# Patient Record
Sex: Female | Born: 1975 | Race: White | Hispanic: No | Marital: Married | Smoking: Never smoker
Health system: Southern US, Community
[De-identification: ages and names within clinical notes are randomized; demographics above are authoritative.]

## PROBLEM LIST (undated history)

## (undated) DIAGNOSIS — G35 Multiple sclerosis: Secondary | ICD-10-CM

## (undated) HISTORY — PX: NO PAST SURGERIES: SHX2092

## (undated) HISTORY — DX: Multiple sclerosis: G35

---

## 1997-05-03 ENCOUNTER — Other Ambulatory Visit: Admission: RE | Admit: 1997-05-03 | Discharge: 1997-05-03 | Payer: Self-pay | Admitting: Obstetrics and Gynecology

## 1997-06-10 ENCOUNTER — Other Ambulatory Visit: Admission: RE | Admit: 1997-06-10 | Discharge: 1997-06-10 | Payer: Self-pay | Admitting: Obstetrics and Gynecology

## 2006-06-07 ENCOUNTER — Other Ambulatory Visit: Admission: RE | Admit: 2006-06-07 | Discharge: 2006-06-07 | Payer: Self-pay | Admitting: Obstetrics and Gynecology

## 2009-12-17 ENCOUNTER — Inpatient Hospital Stay (HOSPITAL_COMMUNITY): Admission: EM | Admit: 2009-12-17 | Discharge: 2009-12-19 | Payer: Self-pay | Admitting: Emergency Medicine

## 2009-12-17 ENCOUNTER — Ambulatory Visit: Payer: Self-pay | Admitting: Internal Medicine

## 2009-12-18 ENCOUNTER — Encounter (INDEPENDENT_AMBULATORY_CARE_PROVIDER_SITE_OTHER): Payer: Self-pay | Admitting: Internal Medicine

## 2010-05-28 LAB — BETA-2-GLYCOPROTEIN I ABS, IGG/M/A
Beta-2 Glyco I IgG: 0 G Units (ref <20)
Beta-2-Glycoprotein I IgM: 2 M Units (ref <20)

## 2010-05-28 LAB — CSF CELL COUNT WITH DIFFERENTIAL
Lymphs, CSF: 100 % — ABNORMAL HIGH (ref 40–80)
Monocyte-Macrophage-Spinal Fluid: 1 % — ABNORMAL LOW (ref 15–45)
RBC Count, CSF: 12 /mm3 — ABNORMAL HIGH
Tube #: 4
WBC, CSF: 18 /mm3 (ref 0–5)

## 2010-05-28 LAB — LUPUS ANTICOAGULANT PANEL
DRVVT: 38 secs (ref 36.2–44.3)
Lupus Anticoagulant: NOT DETECTED

## 2010-05-28 LAB — CBC
HCT: 40.8 % (ref 36.0–46.0)
MCHC: 33.1 g/dL (ref 30.0–36.0)
RDW: 13.6 % (ref 11.5–15.5)
WBC: 9.7 10*3/uL (ref 4.0–10.5)

## 2010-05-28 LAB — CK TOTAL AND CKMB (NOT AT ARMC)
Relative Index: INVALID (ref 0.0–2.5)
Relative Index: INVALID (ref 0.0–2.5)
Relative Index: INVALID (ref 0.0–2.5)
Total CK: 75 U/L (ref 7–177)

## 2010-05-28 LAB — SJOGRENS SYNDROME-B EXTRACTABLE NUCLEAR ANTIBODY: SSB (La) (ENA) Antibody, IgG: <1 AU/mL (ref <30)

## 2010-05-28 LAB — COMPREHENSIVE METABOLIC PANEL
ALT: 11 U/L (ref 0–35)
AST: 15 U/L (ref 0–37)
Albumin: 4.1 g/dL (ref 3.5–5.2)
Alkaline Phosphatase: 65 U/L (ref 39–117)
Calcium: 9.5 mg/dL (ref 8.4–10.5)
GFR calc Af Amer: >60 mL/min (ref >60)
Potassium: 3.6 mEq/L (ref 3.5–5.1)
Sodium: 141 mEq/L (ref 135–145)
Total Protein: 7.2 g/dL (ref 6.0–8.3)

## 2010-05-28 LAB — CARDIOLIPIN ANTIBODIES, IGG, IGM, IGA
Anticardiolipin IgA: 3 APL U/mL — ABNORMAL LOW (ref <22)
Anticardiolipin IgG: 6 GPL U/mL — ABNORMAL LOW (ref <23)

## 2010-05-28 LAB — FUNGUS CULTURE W SMEAR: Fungal Smear: NONE SEEN

## 2010-05-28 LAB — SEDIMENTATION RATE: Sed Rate: 4 mm/hr (ref 0–22)

## 2010-05-28 LAB — LIPID PANEL
Cholesterol: 147 mg/dL (ref 0–200)
LDL Cholesterol: 80 mg/dL (ref 0–99)
Total CHOL/HDL Ratio: 2.7 RATIO
Triglycerides: 58 mg/dL (ref <150)

## 2010-05-28 LAB — DIFFERENTIAL
Basophils Relative: 0 % (ref 0–1)
Eosinophils Absolute: 0.1 10*3/uL (ref 0.0–0.7)
Eosinophils Relative: 1 % (ref 0–5)
Lymphs Abs: 1.9 10*3/uL (ref 0.7–4.0)
Monocytes Absolute: 0.6 10*3/uL (ref 0.1–1.0)
Monocytes Relative: 6 % (ref 3–12)

## 2010-05-28 LAB — ANGIOTENSIN CONVERTING ENZYME: Angiotensin-Converting Enzyme: 29 U/L (ref 8–52)

## 2010-05-28 LAB — BODY FLUID CULTURE

## 2010-05-28 LAB — FACTOR 5 LEIDEN

## 2010-05-28 LAB — TROPONIN I
Troponin I: 0.02 ng/mL (ref 0.00–0.06)
Troponin I: 0.02 ng/mL (ref 0.00–0.06)

## 2010-05-28 LAB — PROTEIN C, TOTAL: Protein C, Total: >150 % — ABNORMAL HIGH (ref 70–140)

## 2010-05-28 LAB — VIRUS CULTURE

## 2010-05-28 LAB — C-REACTIVE PROTEIN: CRP: 0.1 mg/dL — ABNORMAL LOW (ref <0.6)

## 2010-05-28 LAB — GLUCOSE, CAPILLARY
Glucose-Capillary: 104 mg/dL — ABNORMAL HIGH (ref 70–99)
Glucose-Capillary: 153 mg/dL — ABNORMAL HIGH (ref 70–99)

## 2010-05-28 LAB — LYME DISEASE DNA BY PCR(BORRELIA BURG)

## 2010-05-28 LAB — PROTEIN AND GLUCOSE, CSF: Total  Protein, CSF: 52 mg/dL — ABNORMAL HIGH (ref 15–45)

## 2013-10-19 ENCOUNTER — Ambulatory Visit: Payer: Self-pay | Admitting: Diagnostic Neuroimaging

## 2013-10-22 NOTE — Telephone Encounter (Signed)
This encounter was created in error - please disregard.

## 2013-10-23 ENCOUNTER — Encounter: Payer: Self-pay | Admitting: *Deleted

## 2013-10-23 ENCOUNTER — Encounter: Payer: Self-pay | Admitting: Diagnostic Neuroimaging

## 2013-10-23 ENCOUNTER — Ambulatory Visit (INDEPENDENT_AMBULATORY_CARE_PROVIDER_SITE_OTHER): Payer: Medicaid Other | Admitting: Diagnostic Neuroimaging

## 2013-10-23 VITALS — BP 108/69 | HR 68 | Ht 62.0 in | Wt 145.4 lb

## 2013-10-23 DIAGNOSIS — G35 Multiple sclerosis: Secondary | ICD-10-CM | POA: Insufficient documentation

## 2013-10-23 NOTE — Patient Instructions (Signed)
Use gilenya samples. We will send in refill form.

## 2013-10-23 NOTE — Progress Notes (Signed)
[facility]  PATIENT: Anne Valenzuela DOB: February 20, 1976  REFERRING CLINICIAN:  HISTORY FROM: patient  REASON FOR VISIT: follow up   HISTORICAL  CHIEF COMPLAINT:  Chief Complaint  Patient presents with  . Follow-up    HA, MS    HISTORY OF PRESENT ILLNESS:   UPDATE 10/23/13: Patient last seen in clinic during PreferMS trial (last visit 10/25/11). Was getting gilenya through patient assistance. Did not have insurance, and therefore di not follow up in our clinic. Over last 1 year, was "stretching" out gilenya taking it every other day. Now out of meds x 1 week. Now has insurance and trying to get re-established. No new neuro symptoms. Overall doing well. Enrolledi nschool (funeral services program).   UPDATE 11/03/10: Doing well.  Tried TPX and indomethacin for HA, but didn't help.  HA have subsided on their own.   Now interested in PreferMS trial.  UPDATE 07/31/10: Has been having headaches (right sided, throbbing; no nausea, vomiting, photo or phono), almost daily but usually 1 hour after rebif injections.  tried fioricet with mild relief.  HA are 5/10 in severity.  Denies new numbness, weakness or vision changes.  No infx sxs.  UPDATE 05/15/10: Started on Rebif in end of Dec 2011.  Some injection site reactions.  Doing about the same.  Weakness essentially resolved.  Depression sxs better.  UPDATE 03/03/10: Doing much better.  All prior sxs essentially resolved.  Back to driving.  Vision, strength, bladder fx much better.  We discussed multiple sclerosis treatmets.  UPDATE 01/13/10: 38 year old female with possible MS.  Hosp f/u visit.  No new complaints.  RUE slightly better.  Still with blurred vision, balance diff. Patient's symptoms started in July 2011, with nausea and diagnosis of urinary tract infection. She then had a 20 pound weight loss. The beginning of October 2011, patient developed right arm weakness and clumsiness. She also had fuzzy vision, headache and  urge incontinence. She presented to [facility] and was found to have multiple subcortical white matter lesions on CT scan and confirmed on MRI. She had extensive workup with lab testing and spinal. She was treated with IV solumedrol and then oral prednisone.   REVIEW OF SYSTEMS: Full 14 system review of systems performed and notable only for freq painful urination.  ALLERGIES: No Known Allergies  HOME MEDICATIONS: Outpatient Prescriptions Prior to Visit  Medication Sig Dispense Refill  . Fingolimod HCl (GILENYA) 0.5 MG CAPS Take 1 capsule by mouth daily.       No facility-administered medications prior to visit.    PAST MEDICAL HISTORY: Past Medical History  Diagnosis Date  . MS (multiple sclerosis)     PAST SURGICAL HISTORY: History reviewed. No pertinent past surgical history.  FAMILY HISTORY: Family History  Problem Relation Age of Onset  . Diabetes Mother     SOCIAL HISTORY:  History   Social History  . Marital Status: Married    Spouse Name: N/A    Number of Children: 1  . Years of Education: HS   Occupational History  .  Other    student   Social History Main Topics  . Smoking status: Never Smoker   . Smokeless tobacco: Never Used  . Alcohol Use: No  . Drug Use: No  . Sexual Activity: Not on file   Other Topics Concern  . Not on file   Social History Narrative   Patient lives at home with her daughter.   Caffeine Use: tea 3  cups daily     PHYSICAL EXAM  Filed Vitals:   10/23/13 0822  BP: 108/69  Pulse: 68  Height: 5' 2"  (1.575 m)  Weight: 145 lb 6.4 oz (65.953 kg)    Not recorded    Body mass index is 26.59 kg/(m^2).  GENERAL EXAM: Patient is in no distress; well developed, nourished and groomed; neck is supple  CARDIOVASCULAR: Regular rate and rhythm, no murmurs, no carotid bruits  NEUROLOGIC: MENTAL STATUS: awake, alert, oriented to person, place and time, recent and remote memory intact, normal attention and concentration,  language fluent, comprehension intact, naming intact, fund of knowledge appropriate CRANIAL NERVE: no papilledema on fundoscopic exam, pupils equal and reactive to light, visual fields full to confrontation, extraocular muscles intact, no nystagmus, facial sensation and strength symmetric, hearing intact, palate elevates symmetrically, uvula midline, shoulder shrug symmetric, tongue midline. MOTOR: normal bulk and tone, full strength in the BUE, BLE; EXCEPT LLE 4+/5 SENSORY: normal and symmetric to light touch, pinprick, temperature, vibration and proprioception COORDINATION: finger-nose-finger, fine finger movements normal; LLE FOOT TAP SLOWER THAN RIGHT FOOT REFLEXES: deep tendon reflexes present and symmetric; BUE 2, KNEES 3, ANKLES 2, DOWN GOING TOES. GAIT/STATION: narrow based gait; romberg is negative    DIAGNOSTIC DATA (LABS, IMAGING, TESTING) - I reviewed patient records, labs, notes, testing and imaging myself where available.  Lab Results  Component Value Date   WBC 9.7 12/17/2009   HGB 13.5 12/17/2009   HCT 40.8 12/17/2009   MCV 93.4 12/17/2009   PLT 288 12/17/2009      Component Value Date/Time   NA 141 12/17/2009 1302   K 3.6 12/17/2009 1302   CL 110 12/17/2009 1302   CO2 25 12/17/2009 1302   GLUCOSE 90 12/17/2009 1302   BUN 6 12/17/2009 1302   CREATININE 0.65 12/17/2009 1302   CALCIUM 9.5 12/17/2009 1302   PROT 7.2 12/17/2009 1302   ALBUMIN 4.1 12/17/2009 1302   AST 15 12/17/2009 1302   ALT 11 12/17/2009 1302   ALKPHOS 65 12/17/2009 1302   BILITOT 0.5 12/17/2009 1302   GFRNONAA >60 12/17/2009 1302   GFRAA  Value: >60        The eGFR has been calculated using the MDRD equation. This calculation has not been validated in all clinical situations. eGFR's persistently <60 mL/min signify possible Chronic Kidney Disease. 12/17/2009 1302   Lab Results  Component Value Date   CHOL  Value: 147        ATP III CLASSIFICATION:  <200     mg/dL   Desirable  200-239  mg/dL   Borderline High  >=240     mg/dL   High        12/18/2009   HDL 55 12/18/2009   LDLCALC  Value: 80        Total Cholesterol/HDL:CHD Risk Coronary Heart Disease Risk Table                     Men   Women  1/2 Average Risk   3.4   3.3  Average Risk       5.0   4.4  2 X Average Risk   9.6   7.1  3 X Average Risk  23.4   11.0        Use the calculated Patient Ratio above and the CHD Risk Table to determine the patient's CHD Risk.        ATP III CLASSIFICATION (LDL):  <100     mg/dL  Optimal  100-129  mg/dL   Near or Above                    Optimal  130-159  mg/dL   Borderline  160-189  mg/dL   High  >190     mg/dL   Very High 12/18/2009   TRIG 58 12/18/2009   CHOLHDL 2.7 12/18/2009   Lab Results  Component Value Date   HGBA1C  Value: 5.3 (NOTE)                                                                       According to the ADA Clinical Practice Recommendations for 2011, when HbA1c is used as a screening test:   >=6.5%   Diagnostic of Diabetes Mellitus           (if abnormal result  is confirmed)  5.7-6.4%   Increased risk of developing Diabetes Mellitus  References:Diagnosis and Classification of Diabetes Mellitus,Diabetes KGYJ,8563,14(HFWYO 1):S62-S69 and Standards of Medical Care in         Diabetes - 2011,Diabetes VZCH,8850,27  (Suppl 1):S11-S61. 12/18/2009   No results found for this basename: [id number]   Lab Results  Component Value Date   TSH 3.463 12/18/2009    10/13/13 MRI brain (with and without contrast) demonstrating: 1. Multiple supratentorial and infratentorial chronic demyelinating plaques.  2. No abnormal enhancing lesions 3. No significant change from MRI on 05/05/11.   ASSESSMENT AND PLAN  38 y.o. year old female here with relapsing remitting multiple sclerosis dx'd in 2011. Extensive workup was done in the hospital from October 5-7, 2011.  MRI brain showed multiple intracranial lesions, multiple enhancing lesions, including asymptomatic and symptomatic enhancing lesions.  MRI of the cervical and  thoracic spine showed no intrinsic spinal cord lesions. CSF analysis demonstrated greater than 5 oligoclonal bands, with slight elevation in protein.  Extensive testing ruled out other mimicking conditions (HIV, TSH, ANA, hypercoag panel).  Repeat MRI brain on 02/13/10, showed decrease in lesion size and enhancement. Initially on rebif, now on gileyna since 11/17/10.   Dx: RRMS  PLAN: 1. Check MRI, labs 2. Continue gilenya; samples given and enrollment form filled  Orders Placed This Encounter  Procedures  . MR Brain W Wo Contrast  . CBC With differential/Platelet  . Vit D  25 hydroxy (rtn osteoporosis monitoring)   Return in about 3 months (around 01/23/2014).    Penni Bombard, MD 10/01/9468, 9:62 AM Certified in Neurology, Neurophysiology and Neuroimaging  [facility] [address], [place], [place] [address] [phone]

## 2013-10-24 LAB — CBC WITH DIFFERENTIAL
BASOS ABS: 0 10*3/uL (ref 0.0–0.2)
Basos: 0 %
Eos: 2 %
Eosinophils Absolute: 0.2 10*3/uL (ref 0.0–0.4)
HEMATOCRIT: 42.3 % (ref 34.0–46.6)
Hemoglobin: 13.9 g/dL (ref 11.1–15.9)
IMMATURE GRANULOCYTES: 0 %
Immature Grans (Abs): 0 10*3/uL (ref 0.0–0.1)
LYMPHS ABS: 0.9 10*3/uL (ref 0.7–3.1)
Lymphs: 10 %
MCH: 30.5 pg (ref 26.6–33.0)
MCHC: 32.9 g/dL (ref 31.5–35.7)
MCV: 93 fL (ref 79–97)
MONOS ABS: 0.6 10*3/uL (ref 0.1–0.9)
Monocytes: 8 %
NEUTROS ABS: 6.5 10*3/uL (ref 1.4–7.0)
Neutrophils Relative %: 80 %
PLATELETS: 276 10*3/uL (ref 150–379)
RBC: 4.56 x10E6/uL (ref 3.77–5.28)
RDW: 13.7 % (ref 12.3–15.4)
WBC: 8.2 10*3/uL (ref 3.4–10.8)

## 2013-10-24 LAB — VITAMIN D 25 HYDROXY (VIT D DEFICIENCY, FRACTURES): Vit D, 25-Hydroxy: 35.2 ng/mL (ref 30.0–100.0)

## 2013-10-26 ENCOUNTER — Inpatient Hospital Stay
Admission: RE | Admit: 2013-10-26 | Discharge: 2013-10-26 | Disposition: A | Discharge status: Home / Self Care | Payer: Self-pay | Source: Ambulatory Visit | Attending: Diagnostic Neuroimaging | Admitting: Diagnostic Neuroimaging

## 2013-11-05 ENCOUNTER — Ambulatory Visit
Admission: RE | Admit: 2013-11-05 | Discharge: 2013-11-05 | Disposition: A | Discharge status: Home / Self Care | Payer: Medicaid Other | Source: Ambulatory Visit | Attending: Diagnostic Neuroimaging | Admitting: Diagnostic Neuroimaging

## 2013-11-05 DIAGNOSIS — G35 Multiple sclerosis: Secondary | ICD-10-CM

## 2013-11-05 MED ORDER — GADOBENATE DIMEGLUMINE 529 MG/ML IV SOLN
13.0000 mL | Freq: Once | INTRAVENOUS | Status: AC | PRN
  Administered 2013-11-05: 13 mL via INTRAVENOUS

## 2014-01-31 ENCOUNTER — Ambulatory Visit (INDEPENDENT_AMBULATORY_CARE_PROVIDER_SITE_OTHER): Payer: Medicaid Other | Admitting: Diagnostic Neuroimaging

## 2014-01-31 ENCOUNTER — Encounter: Payer: Self-pay | Admitting: Diagnostic Neuroimaging

## 2014-01-31 VITALS — BP 112/74 | HR 65 | Temp 97.4°F | Ht 63.0 in | Wt 140.0 lb

## 2014-01-31 DIAGNOSIS — G35 Multiple sclerosis: Secondary | ICD-10-CM

## 2014-01-31 MED ORDER — SERTRALINE HCL 25 MG PO TABS: 25.0000 mg | ORAL_TABLET | Freq: Every day | ORAL | Status: DC

## 2014-01-31 NOTE — Progress Notes (Signed)
[facility]  PATIENT: Anne Valenzuela DOB: 02/15/1976  REFERRING CLINICIAN:  HISTORY FROM: patient  REASON FOR VISIT: follow up   HISTORICAL  CHIEF COMPLAINT:  Chief Complaint  Patient presents with  . Follow-up    MS    HISTORY OF PRESENT ILLNESS:   UPDATE 01/31/14: Since last visit, is back on gilenya consistently. 2-3 weeks ago, had right hand numb/weak, slurred speech, bladder incontinence. Symptoms now almost resolved for 3 days. Didn't call us b/c she had this appt planned for today. Also with more depression, anxiety.   UPDATE 10/23/13: Patient last seen in clinic during PreferMS trial (last visit 10/25/11). Was getting gilenya through patient assistance. Did not have insurance, and therefore did not follow up in our clinic. Over last 1 year, was "stretching" out gilenya taking it every other day. Now out of meds x 1 week. Now has insurance and trying to get re-established. No new neuro symptoms. Overall doing well. Enrolledi nschool (funeral services program).   UPDATE 11/03/10: Doing well.  Tried TPX and indomethacin for HA, but didn't help.  HA have subsided on their own.   Now interested in PreferMS trial.  UPDATE 07/31/10: Has been having headaches (right sided, throbbing; no nausea, vomiting, photo or phono), almost daily but usually 1 hour after rebif injections.  tried fioricet with mild relief.  HA are 5/10 in severity.  Denies new numbness, weakness or vision changes.  No infx sxs.  UPDATE 05/15/10: Started on Rebif in end of Dec 2011.  Some injection site reactions.  Doing about the same.  Weakness essentially resolved.  Depression sxs better.  UPDATE 03/03/10: Doing much better.  All prior sxs essentially resolved.  Back to driving.  Vision, strength, bladder fx much better.  We discussed multiple sclerosis treatmets.  UPDATE 01/13/10: 38 year old female with possible MS.  Hosp f/u visit.  No new complaints.  RUE slightly better.  Still with  blurred vision, balance diff. Patient's symptoms started in July 2011, with nausea and diagnosis of urinary tract infection. She then had a 20 pound weight loss. The beginning of October 2011, patient developed right arm weakness and clumsiness. She also had fuzzy vision, headache and urge incontinence. She presented to [facility] and was found to have multiple subcortical white matter lesions on CT scan and confirmed on MRI. She had extensive workup with lab testing and spinal. She was treated with IV solumedrol and then oral prednisone.   REVIEW OF SYSTEMS: Full 14 system review of systems performed and notable as per HPI.  ALLERGIES: No Known Allergies  HOME MEDICATIONS: Outpatient Prescriptions Prior to Visit  Medication Sig Dispense Refill  . Fingolimod HCl (GILENYA) 0.5 MG CAPS Take 1 capsule by mouth daily.     No facility-administered medications prior to visit.    PAST MEDICAL HISTORY: Past Medical History  Diagnosis Date  . MS (multiple sclerosis)     PAST SURGICAL HISTORY: History reviewed. No pertinent past surgical history.  FAMILY HISTORY: Family History  Problem Relation Age of Onset  . Diabetes Mother     SOCIAL HISTORY:  History   Social History  . Marital Status: Married    Spouse Name: N/A    Number of Children: 1  . Years of Education: HS   Occupational History  .  Other    student   Social History Main Topics  . Smoking status: Never Smoker   . Smokeless tobacco: Never Used  . Alcohol Use: No  .  Drug Use: No  . Sexual Activity: Not on file   Other Topics Concern  . Not on file   Social History Narrative   Patient lives at home with her daughter.   Caffeine Use: tea 3 cups daily     PHYSICAL EXAM  Filed Vitals:   01/31/14 1531  BP: 112/74  Pulse: 65  Temp: 97.4 F (36.3 C)  TempSrc: Oral  Height: _0  (1.6 m)  Weight: 140 lb (63.504 kg)    Not recorded      Body mass index is 24.81 kg/(m^2).  GENERAL EXAM: Patient  is in no distress; well developed, nourished and groomed; neck is supple  CARDIOVASCULAR: Regular rate and rhythm, no murmurs, no carotid bruits  NEUROLOGIC: MENTAL STATUS: awake, alert, oriented to person, place and time, recent and remote memory intact, normal attention and concentration, language fluent, comprehension intact, naming intact, fund of knowledge appropriate CRANIAL NERVE: no papilledema on fundoscopic exam, pupils equal and reactive to light, visual fields full to confrontation, extraocular muscles intact, no nystagmus, facial sensation and strength symmetric, hearing intact, palate elevates symmetrically, uvula midline, shoulder shrug symmetric, tongue midline. MOTOR: normal bulk and tone, full strength in the BUE, BLE; EXCEPT RUE 4+ GRIP, RLE 4+;  SENSORY: normal and symmetric to light touch COORDINATION: finger-nose-finger, fine finger movements normal; SLOW IN RUE AND RLE. REFLEXES: deep tendon reflexes present and symmetric; BUE 2, KNEES 3, ANKLES 2 GAIT/STATION: narrow based gait; SLIGHT LIMP WITH RIGHT LEG     DIAGNOSTIC DATA (LABS, IMAGING, TESTING) - I reviewed patient records, labs, notes, testing and imaging myself where available.  Lab Results  Component Value Date   WBC 8.2 10/23/2013   HGB 13.9 10/23/2013   HCT 42.3 10/23/2013   MCV 93 10/23/2013   PLT 276 10/23/2013      Component Value Date/Time   NA 141 12/17/2009 1302   K 3.6 12/17/2009 1302   CL 110 12/17/2009 1302   CO2 25 12/17/2009 1302   GLUCOSE 90 12/17/2009 1302   BUN 6 12/17/2009 1302   CREATININE 0.65 12/17/2009 1302   CALCIUM 9.5 12/17/2009 1302   PROT 7.2 12/17/2009 1302   ALBUMIN 4.1 12/17/2009 1302   AST 15 12/17/2009 1302   ALT 11 12/17/2009 1302   ALKPHOS 65 12/17/2009 1302   BILITOT 0.5 12/17/2009 1302   GFRNONAA >60 12/17/2009 1302   GFRAA  12/17/2009 1302    >60        The eGFR has been calculated using the MDRD equation. This calculation has not been validated in  all clinical situations. eGFR's persistently <60 mL/min signify possible Chronic Kidney Disease.   Lab Results  Component Value Date   CHOL  12/18/2009    147        ATP III CLASSIFICATION:  <200     mg/dL   Desirable  200-239  mg/dL   Borderline High  >=240    mg/dL   High          HDL 55 12/18/2009   LDLCALC  12/18/2009    80        Total Cholesterol/HDL:CHD Risk Coronary Heart Disease Risk Table                     Men   Women  1/2 Average Risk   3.4   3.3  Average Risk       5.0   4.4  2 X Average Risk   9.6  7.1  3 X Average Risk  23.4   11.0        Use the calculated Patient Ratio above and the CHD Risk Table to determine the patient's CHD Risk.        ATP III CLASSIFICATION (LDL):  <100     mg/dL   Optimal  100-129  mg/dL   Near or Above                    Optimal  130-159  mg/dL   Borderline  160-189  mg/dL   High  >190     mg/dL   Very High   TRIG 58 12/18/2009   CHOLHDL 2.7 12/18/2009   Lab Results  Component Value Date   HGBA1C  12/18/2009    5.3 (NOTE)                                                                       According to the ADA Clinical Practice Recommendations for 2011, when HbA1c is used as a screening test:   >=6.5%   Diagnostic of Diabetes Mellitus           (if abnormal result  is confirmed)  5.7-6.4%   Increased risk of developing Diabetes Mellitus  References:Diagnosis and Classification of Diabetes Mellitus,Diabetes TDVV,6160,73(XTGGY 1):S62-S69 and Standards of Medical Care in         Diabetes - 2011,Diabetes Care,2011,34  (Suppl 1):S11-S61.   No results found for: VITAMINB12 Lab Results  Component Value Date   TSH 3.463 12/18/2009    10/13/13 MRI brain (with and without contrast) demonstrating: 1. Multiple supratentorial and infratentorial chronic demyelinating plaques.  2. No abnormal enhancing lesions 3. No significant change from MRI on 05/05/11.   ASSESSMENT AND PLAN  38 y.o. year old female here with relapsing  remitting multiple sclerosis dx'd in 2011. Extensive workup was done in the hospital from October 5-7, 2011.  MRI brain showed multiple intracranial lesions, multiple enhancing lesions, including asymptomatic and symptomatic enhancing lesions.  MRI of the cervical and thoracic spine showed no intrinsic spinal cord lesions. CSF analysis demonstrated greater than 5 oligoclonal bands, with slight elevation in protein.  Extensive testing ruled out other mimicking conditions (HIV, TSH, ANA, hypercoag panel).  Repeat MRI brain on 02/13/10, showed decrease in lesion size and enhancement. Initially on rebif, now on gileyna since 11/17/10.   Now with possible MS exacerbation 2-3 weeks ago, with almost complete resolution for past 2-3 days.   Dx: RRMS  PLAN: 1. Check MRI, labs 2. Continue gilenya; may consider changing gilenya after testing 3. Sertraline for depression/anxiety  Orders Placed This Encounter  Procedures  . MR Brain W Wo Contrast  . MR Cervical Spine W Wo Contrast  . CBC With differential/Platelet  . Hepatic function panel  . Stratify JCV Antibody Test ([facility])  . Vit D  25 hydroxy (rtn osteoporosis monitoring)   Meds ordered this encounter  Medications  . sertraline (ZOLOFT) 25 MG tablet    Sig: Take 1 tablet (25 mg total) by mouth daily.    Dispense:  30 tablet    Refill:  6   Return in about 1 month (around 03/02/2014).    Penni Bombard, MD 01/31/2014, 3:59 PM  Certified in Neurology, Neurophysiology and [facility] [address] [place], [place] [address] [phone]

## 2014-02-01 LAB — CBC WITH DIFFERENTIAL
Basophils Absolute: 0 10*3/uL (ref 0.0–0.2)
Basos: 0 %
EOS: 3 %
Eosinophils Absolute: 0.1 10*3/uL (ref 0.0–0.4)
HEMATOCRIT: 41.4 % (ref 34.0–46.6)
Hemoglobin: 14 g/dL (ref 11.1–15.9)
IMMATURE GRANULOCYTES: 0 %
Immature Grans (Abs): 0 10*3/uL (ref 0.0–0.1)
LYMPHS ABS: 0.5 10*3/uL — AB (ref 0.7–3.1)
Lymphs: 11 %
MCH: 30.4 pg (ref 26.6–33.0)
MCHC: 33.8 g/dL (ref 31.5–35.7)
MCV: 90 fL (ref 79–97)
MONOCYTES: 14 %
MONOS ABS: 0.7 10*3/uL (ref 0.1–0.9)
NEUTROS ABS: 3.5 10*3/uL (ref 1.4–7.0)
Neutrophils Relative %: 72 %
PLATELETS: 294 10*3/uL (ref 150–379)
RBC: 4.6 x10E6/uL (ref 3.77–5.28)
RDW: 13.4 % (ref 12.3–15.4)
WBC: 4.8 10*3/uL (ref 3.4–10.8)

## 2014-02-01 LAB — HEPATIC FUNCTION PANEL
ALBUMIN: 4.8 g/dL (ref 3.5–5.5)
ALK PHOS: 66 IU/L (ref 39–117)
ALT: 22 IU/L (ref 0–32)
AST: 22 IU/L (ref 0–40)
BILIRUBIN TOTAL: 0.4 mg/dL (ref 0.0–1.2)
Bilirubin, Direct: 0.11 mg/dL (ref 0.00–0.40)
Total Protein: 7.1 g/dL (ref 6.0–8.5)

## 2014-02-01 LAB — VITAMIN D 25 HYDROXY (VIT D DEFICIENCY, FRACTURES): Vit D, 25-Hydroxy: 39.1 ng/mL (ref 30.0–100.0)

## 2014-02-22 ENCOUNTER — Ambulatory Visit
Admission: RE | Admit: 2014-02-22 | Discharge: 2014-02-22 | Disposition: A | Discharge status: Home / Self Care | Payer: Medicaid Other | Source: Ambulatory Visit | Attending: Diagnostic Neuroimaging | Admitting: Diagnostic Neuroimaging

## 2014-02-22 ENCOUNTER — Encounter (INDEPENDENT_AMBULATORY_CARE_PROVIDER_SITE_OTHER): Payer: Medicaid Other | Admitting: Diagnostic Neuroimaging

## 2014-02-22 DIAGNOSIS — G35 Multiple sclerosis: Secondary | ICD-10-CM

## 2014-03-04 ENCOUNTER — Encounter: Payer: Self-pay | Admitting: Diagnostic Neuroimaging

## 2014-03-04 ENCOUNTER — Ambulatory Visit (INDEPENDENT_AMBULATORY_CARE_PROVIDER_SITE_OTHER): Payer: Medicaid Other | Admitting: Diagnostic Neuroimaging

## 2014-03-04 VITALS — BP 99/64 | HR 69 | Ht 62.0 in | Wt 138.8 lb

## 2014-03-04 DIAGNOSIS — G35 Multiple sclerosis: Secondary | ICD-10-CM

## 2014-03-04 NOTE — Patient Instructions (Signed)
Continue gilenya and sertraline.

## 2014-03-04 NOTE — Progress Notes (Signed)
[facility]  PATIENT: Anne Valenzuela DOB: 15-Jul-1975  REFERRING CLINICIAN:  HISTORY FROM: patient and daughter REASON FOR VISIT: follow up   HISTORICAL  CHIEF COMPLAINT:  Chief Complaint  Patient presents with  . Follow-up    RM 7  . Multiple Sclerosis    HISTORY OF PRESENT ILLNESS:   UPDATE 03/04/14: Since last visit, she feels better. Sertraline helping with depression and anxiety, but causing mild diarrhea. Gilenya is stable. No new neuro events.   UPDATE 01/31/14: Since last visit, is back on gilenya consistently. 2-3 weeks ago, had right hand numb/weak, slurred speech, bladder incontinence. Symptoms now almost resolved for 3 days. Didn't call us b/c she had this appt planned for today. Also with more depression, anxiety.   UPDATE 10/23/13: Patient last seen in clinic during [facility] trial (last visit 10/25/11). Was getting gilenya through patient assistance. Did not have insurance, and therefore did not follow up in our clinic. Over last 1 year, was "stretching" out gilenya taking it every other day. Now out of meds x 1 week. Now has insurance and trying to get re-established. No new neuro symptoms. Overall doing well. Enrolledi nschool (funeral services program).   UPDATE 11/03/10: Doing well.  Tried TPX and indomethacin for HA, but didn't help.  HA have subsided on their own.   Now interested in PreferMS trial.  UPDATE 07/31/10: Has been having headaches (right sided, throbbing; no nausea, vomiting, photo or phono), almost daily but usually 1 hour after rebif injections.  tried fioricet with mild relief.  HA are 5/10 in severity.  Denies new numbness, weakness or vision changes.  No infx sxs.  UPDATE 05/15/10: Started on Rebif in end of Dec 2011.  Some injection site reactions.  Doing about the same.  Weakness essentially resolved.  Depression sxs better.  UPDATE 03/03/10: Doing much better.  All prior sxs essentially resolved.  Back to driving.  Vision,  strength, bladder fx much better.  We discussed multiple sclerosis treatmets.  UPDATE 01/13/10: 38 year old female with possible MS.  Hosp f/u visit.  No new complaints.  RUE slightly better.  Still with blurred vision, balance diff. Patient's symptoms started in July 2011, with nausea and diagnosis of urinary tract infection. She then had a 20 pound weight loss. The beginning of October 2011, patient developed right arm weakness and clumsiness. She also had fuzzy vision, headache and urge incontinence. She presented to [facility] and was found to have multiple subcortical white matter lesions on CT scan and confirmed on MRI. She had extensive workup with lab testing and spinal. She was treated with IV solumedrol and then oral prednisone.   REVIEW OF SYSTEMS: Full 14 system review of systems performed and notable as per HPI.  ALLERGIES: No Known Allergies  HOME MEDICATIONS: Outpatient Prescriptions Prior to Visit  Medication Sig Dispense Refill  . Fingolimod HCl (GILENYA) 0.5 MG CAPS Take 1 capsule by mouth daily.    . sertraline (ZOLOFT) 25 MG tablet Take 1 tablet (25 mg total) by mouth daily. 30 tablet 6   No facility-administered medications prior to visit.    PAST MEDICAL HISTORY: Past Medical History  Diagnosis Date  . MS (multiple sclerosis)     PAST SURGICAL HISTORY: Past Surgical History  Procedure Laterality Date  . No past surgeries      FAMILY HISTORY: Family History  Problem Relation Age of Onset  . Diabetes Mother     SOCIAL HISTORY:  History   Social History  .  Marital Status: Married    Spouse Name: N/A    Number of Children: 1  . Years of Education: HS   Occupational History  .  Other    student   Social History Main Topics  . Smoking status: Never Smoker   . Smokeless tobacco: Never Used  . Alcohol Use: No  . Drug Use: No  . Sexual Activity: Not on file   Other Topics Concern  . Not on file   Social History Narrative   Patient lives at  home with her daughter.   Caffeine Use: tea 3 cups daily     PHYSICAL EXAM  Filed Vitals:   03/04/14 1516  BP: 99/64  Pulse: 69  Height: 5' 2"  (1.575 m)  Weight: 138 lb 12.8 oz (62.959 kg)    Not recorded      Body mass index is 25.38 kg/(m^2).  GENERAL EXAM: Patient is in no distress; well developed, nourished and groomed; neck is supple  CARDIOVASCULAR: Regular rate and rhythm, no murmurs, no carotid bruits  NEUROLOGIC: MENTAL STATUS: awake, alert, language fluent, comprehension intact, naming intact, fund of knowledge appropriate CRANIAL NERVE: no papilledema on fundoscopic exam, pupils equal and reactive to light, visual fields full to confrontation, extraocular muscles intact, no nystagmus, facial sensation and strength symmetric, hearing intact, palate elevates symmetrically, uvula midline, shoulder shrug symmetric, tongue midline. MOTOR: normal bulk and tone, full strength in the BUE, BLE SENSORY: normal and symmetric to light touch COORDINATION: finger-nose-finger, fine finger movements normal; SLOW IN RUE REFLEXES: deep tendon reflexes present and symmetric; BUE 2, KNEES 3, ANKLES 2 GAIT/STATION: narrow based gait; SLIGHT LIMP WITH RIGHT LEG; SLIGHT DIFF WITH TANDEM     DIAGNOSTIC DATA (LABS, IMAGING, TESTING) - I reviewed patient records, labs, notes, testing and imaging myself where available.  Lab Results  Component Value Date   WBC 4.8 01/31/2014   HGB 14.0 01/31/2014   HCT 41.4 01/31/2014   MCV 90 01/31/2014   PLT 294 01/31/2014      Component Value Date/Time   NA 141 12/17/2009 1302   K 3.6 12/17/2009 1302   CL 110 12/17/2009 1302   CO2 25 12/17/2009 1302   GLUCOSE 90 12/17/2009 1302   BUN 6 12/17/2009 1302   CREATININE 0.65 12/17/2009 1302   CALCIUM 9.5 12/17/2009 1302   PROT 7.1 01/31/2014 1604   PROT 7.2 12/17/2009 1302   ALBUMIN 4.1 12/17/2009 1302   AST 22 01/31/2014 1604   ALT 22 01/31/2014 1604   ALKPHOS 66 01/31/2014 1604    BILITOT 0.4 01/31/2014 1604   GFRNONAA >60 12/17/2009 1302   GFRAA  12/17/2009 1302    >60        The eGFR has been calculated using the MDRD equation. This calculation has not been validated in all clinical situations. eGFR's persistently <60 mL/min signify possible Chronic Kidney Disease.   Lab Results  Component Value Date   CHOL  12/18/2009    147        ATP III CLASSIFICATION:  <200     mg/dL   Desirable  200-239  mg/dL   Borderline High  >=240    mg/dL   High          HDL 55 12/18/2009   LDLCALC  12/18/2009    80        Total Cholesterol/HDL:CHD Risk Coronary Heart Disease Risk Table  Men   Women  1/2 Average Risk   3.4   3.3  Average Risk       5.0   4.4  2 X Average Risk   9.6   7.1  3 X Average Risk  23.4   11.0        Use the calculated Patient Ratio above and the CHD Risk Table to determine the patient's CHD Risk.        ATP III CLASSIFICATION (LDL):  <100     mg/dL   Optimal  100-129  mg/dL   Near or Above                    Optimal  130-159  mg/dL   Borderline  160-189  mg/dL   High  >190     mg/dL   Very High   TRIG 58 12/18/2009   CHOLHDL 2.7 12/18/2009   Lab Results  Component Value Date   HGBA1C  12/18/2009    5.3 (NOTE)                                                                       According to the ADA Clinical Practice Recommendations for 2011, when HbA1c is used as a screening test:   >=6.5%   Diagnostic of Diabetes Mellitus           (if abnormal result  is confirmed)  5.7-6.4%   Increased risk of developing Diabetes Mellitus  References:Diagnosis and Classification of Diabetes Mellitus,Diabetes ZOXW,9604,54(UJWJX 1):S62-S69 and Standards of Medical Care in         Diabetes - 2011,Diabetes Care,2011,34  (Suppl 1):S11-S61.   No results found for: VITAMINB12 Lab Results  Component Value Date   TSH 3.463 12/18/2009   VIT D, 25-HYDROXY  Date Value Ref Range Status  01/31/2014 39.1 30.0 - 100.0 ng/mL Final     Comment:    Vitamin D deficiency has been defined by the [place] practice guideline as a level of serum 25-OH vitamin D less than 20 ng/mL (1,2). The Endocrine Society went on to further define vitamin D insufficiency as a level between 21 and 29 ng/mL (2). 1. IOM (Institute of Medicine). 2010. Dietary reference    intakes for calcium and D. [place]: The    [employer]. 2. Holick MF, Binkley [place], Bischoff-Ferrari HA, et al.    Evaluation, treatment, and prevention of vitamin D    deficiency: an Endocrine Society clinical practice    guideline. JCEM. 2011 Jul; 96(7):1911-30.   10/23/2013 35.2 30.0 - 100.0 ng/mL Final    Comment:    Vitamin D deficiency has been defined by the [place] practice guideline as a level of serum 25-OH vitamin D less than 20 ng/mL (1,2). The Endocrine Society went on to further define vitamin D insufficiency as a level between 21 and 29 ng/mL (2). 1. IOM (Institute of Medicine). 2010. Dietary reference    intakes for calcium and D. [place]: The    [employer]. 2. Holick MF, Binkley [place], Bischoff-Ferrari HA, et al.    Evaluation, treatment, and prevention of vitamin D    deficiency: an Endocrine Society  clinical practice    guideline. JCEM. 2011 Jul; 96(7):1911-30.   I reviewed images myself and agree with interpretation. -VRP  11/05/13 MRI brain (with and without contrast) demonstrating: 1. Multiple supratentorial and infratentorial chronic demyelinating plaques.  2. No abnormal enhancing lesions 3. No significant change from MRI on 05/05/11.  02/22/14 MRI brain (with and without) demonstrating: 1. Multiple periventricular, subcortical, juxtacortical, right pontine and left medullary chronic demyelinating plaques. Some of these are confluent. Some of these are hypointense on T1 views.  2. There are at least 2 small left fronto-parietal DWI  hyperintense lesions (isointense on ADC), without enhancement, may represent subacute demyelinating plaques.  3. No abnormal enhancing lesions. 4. Compared to MRI on 11/05/13, there is a new plaque noted in the left frontal region (series 3 image 84; series 8 image 15) with subacute-chronic features, and correlates with patient's recent exacerbation in Nov 2015. Otherwise no significant change.   02/22/14 MRI cervical spine (with and without) demonstrating: 1. The spinal cord is notable chronic demyelinating plaques at the cervico-medullary junction and C2-3 levels. 2. Disc bulging from C2-3 to C6-7. No spinal stenosis or foraminal narrowing. 3. Compared to MRI 12/18/09, the C2-3 plaque is new. The cervico-medullary plaques have decreased in size.  01/31/14 JCV ab - 2.11 (H)     ASSESSMENT AND PLAN  38 y.o. year old female here with relapsing remitting multiple sclerosis dx'd in 2011. Extensive workup was done in the hospital from October 5-7, 2011.  MRI brain showed multiple intracranial lesions, multiple enhancing lesions, including asymptomatic and symptomatic enhancing lesions.  MRI of the cervical and thoracic spine showed no intrinsic spinal cord lesions. CSF analysis demonstrated greater than 5 oligoclonal bands, with slight elevation in protein.  Extensive testing ruled out other mimicking conditions (HIV, TSH, ANA, hypercoag panel).  Repeat MRI brain on 02/13/10, showed decrease in lesion size and enhancement. Initially on rebif, now on gileyna since 11/17/10.   Now with possible MS exacerbation in Nov 2015, in setting of inconsistent gilenya use in 2015.  Dx: RRMS  PLAN: 1. Discussed whether to switch gilenya to tecfidera or tysabri; patient chose to stay on gilenya but be more consistent 2. Continue sertraline for depression/anxiety  Return in about 3 months (around 06/03/2014).    Penni Bombard, MD 62/44/6950, 7:22 PM Certified in Neurology, Neurophysiology and  Neuroimaging  [facility] [address], [place], [place] [address] [phone]

## 2014-04-09 ENCOUNTER — Telehealth: Payer: Self-pay | Admitting: *Deleted

## 2014-04-09 NOTE — Telephone Encounter (Signed)
Waiting on call back to change appt

## 2014-04-11 ENCOUNTER — Telehealth: Payer: Self-pay | Admitting: *Deleted

## 2014-04-11 NOTE — Telephone Encounter (Signed)
Spoke with pt on the phone and got her appt changed.

## 2014-05-28 ENCOUNTER — Telehealth: Payer: Self-pay | Admitting: Diagnostic Neuroimaging

## 2014-05-28 NOTE — Telephone Encounter (Signed)
Can you follow up with patient? May need follow up appt, psychiatry/psychology eval, vs incr medications. -VRP

## 2014-05-28 NOTE — Telephone Encounter (Signed)
Patient requesting increase of dosage for Rx sertraline (ZOLOFT) 25 MG tablet, stated medication isn't work as well as it did.  Please forward Rx to [facility].  Please call and advise.

## 2014-06-03 ENCOUNTER — Telehealth: Payer: Self-pay | Admitting: *Deleted

## 2014-06-03 NOTE — Telephone Encounter (Signed)
Called and left a message for the pt stating that Dr. Marjory Lies would like to talk with her in the office first before changing medications and would even suggest that she follow-up with a psychiatrist. I asked to pt to keep her appt on Friday March 25 at 930 am and that we could talk further while she was here.

## 2014-06-07 ENCOUNTER — Ambulatory Visit (INDEPENDENT_AMBULATORY_CARE_PROVIDER_SITE_OTHER): Payer: Medicaid Other | Admitting: Diagnostic Neuroimaging

## 2014-06-07 ENCOUNTER — Encounter: Payer: Self-pay | Admitting: Diagnostic Neuroimaging

## 2014-06-07 VITALS — BP 110/65 | HR 63 | Ht 62.0 in | Wt 138.4 lb

## 2014-06-07 DIAGNOSIS — F411 Generalized anxiety disorder: Secondary | ICD-10-CM

## 2014-06-07 DIAGNOSIS — G35 Multiple sclerosis: Secondary | ICD-10-CM

## 2014-06-07 MED ORDER — DULOXETINE HCL 30 MG PO CPEP: 30.0000 mg | ORAL_CAPSULE | Freq: Every day | ORAL | Status: DC

## 2014-06-07 NOTE — Progress Notes (Signed)
[facility]  PATIENT: Anne Valenzuela DOB: 09/24/1975  REFERRING CLINICIAN:  HISTORY FROM: patient and husband REASON FOR VISIT: follow up   HISTORICAL  CHIEF COMPLAINT:  Chief Complaint  Patient presents with  . Follow-up    Multiple Sclerosis     HISTORY OF PRESENT ILLNESS:   UPDATE 06/07/14: Since last visit, no new neuro symptoms. Anxiety still diff, usually with specific situations. Today she feels calm. She stopped sertraline due to diarrhea, then anxiety worsened. Now back on sertraline. Having some urinary incont (sudden urge).   UPDATE 03/04/14: Since last visit, she feels better. Sertraline helping with depression and anxiety, but causing mild diarrhea. Gilenya is stable. No new neuro events.   UPDATE 01/31/14: Since last visit, is back on gilenya consistently. 2-3 weeks ago, had right hand numb/weak, slurred speech, bladder incontinence. Symptoms now almost resolved for 3 days. Didn't call us b/c she had this appt planned for today. Also with more depression, anxiety.   UPDATE 10/23/13: Patient last seen in clinic during [facility] trial (last visit 10/25/11). Was getting gilenya through patient assistance. Did not have insurance, and therefore did not follow up in our clinic. Over last 1 year, was "stretching" out gilenya taking it every other day. Now out of meds x 1 week. Now has insurance and trying to get re-established. No new neuro symptoms. Overall doing well. Enrolledi nschool (funeral services program).   UPDATE 11/03/10: Doing well.  Tried TPX and indomethacin for HA, but didn't help.  HA have subsided on their own.   Now interested in PreferMS trial.  UPDATE 07/31/10: Has been having headaches (right sided, throbbing; no nausea, vomiting, photo or phono), almost daily but usually 1 hour after rebif injections.  tried fioricet with mild relief.  HA are 5/10 in severity.  Denies new numbness, weakness or vision changes.  No infx sxs.  UPDATE  05/15/10: Started on Rebif in end of Dec 2011.  Some injection site reactions.  Doing about the same.  Weakness essentially resolved.  Depression sxs better.  UPDATE 03/03/10: Doing much better.  All prior sxs essentially resolved.  Back to driving.  Vision, strength, bladder fx much better.  We discussed multiple sclerosis treatmets.  UPDATE 01/13/10: 39 year old female with possible MS.  Hosp f/u visit.  No new complaints.  RUE slightly better.  Still with blurred vision, balance diff. Patient's symptoms started in July 2011, with nausea and diagnosis of urinary tract infection. She then had a 20 pound weight loss. The beginning of October 2011, patient developed right arm weakness and clumsiness. She also had fuzzy vision, headache and urge incontinence. She presented to [facility] and was found to have multiple subcortical white matter lesions on CT scan and confirmed on MRI. She had extensive workup with lab testing and spinal. She was treated with IV solumedrol and then oral prednisone.   REVIEW OF SYSTEMS: Full 14 system review of systems performed and notable only for incontinence freq urination decr concentration agitation anxiety depression.  ALLERGIES: No Known Allergies  HOME MEDICATIONS: Outpatient Prescriptions Prior to Visit  Medication Sig Dispense Refill  . Fingolimod HCl (GILENYA) 0.5 MG CAPS Take 1 capsule by mouth daily.    . sertraline (ZOLOFT) 25 MG tablet Take 1 tablet (25 mg total) by mouth daily. 30 tablet 6   No facility-administered medications prior to visit.    PAST MEDICAL HISTORY: Past Medical History  Diagnosis Date  . MS (multiple sclerosis)     PAST  SURGICAL HISTORY: Past Surgical History  Procedure Laterality Date  . No past surgeries      FAMILY HISTORY: Family History  Problem Relation Age of Onset  . Diabetes Mother     SOCIAL HISTORY:  History   Social History  . Marital Status: Married    Spouse Name: N/A  . Number of Children: 1    . Years of Education: HS   Occupational History  .  Other    student   Social History Main Topics  . Smoking status: Never Smoker   . Smokeless tobacco: Never Used  . Alcohol Use: No  . Drug Use: No  . Sexual Activity: Not on file   Other Topics Concern  . Not on file   Social History Narrative   Patient lives at home with her daughter.   Caffeine Use: tea 3 cups daily     PHYSICAL EXAM  Filed Vitals:   06/07/14 0919  BP: 110/65  Pulse: 63  Height: 5' 2"  (1.575 m)  Weight: 138 lb 6.4 oz (62.778 kg)    Not recorded      Body mass index is 25.31 kg/(m^2).  GENERAL EXAM: Patient is in no distress; well developed, nourished and groomed; neck is supple  CARDIOVASCULAR: Regular rate and rhythm, no murmurs, no carotid bruits  NEUROLOGIC: MENTAL STATUS: awake, alert, language fluent, comprehension intact, naming intact, fund of knowledge appropriate CRANIAL NERVE: pupils equal and reactive to light, visual fields full to confrontation, extraocular muscles intact, no nystagmus, facial sensation and strength symmetric, hearing intact, palate elevates symmetrically, uvula midline, shoulder shrug symmetric, tongue midline. MOTOR: normal bulk and tone, full strength in the RUE, LLE; LUE 4 TRICEPS; RLE DF 4 SENSORY: normal and symmetric to light touch COORDINATION: RAPID ALT MOVEMENTS SLOW IN LUE AND RLE REFLEXES: deep tendon reflexes present and symmetric; BUE 2, KNEES 3, ANKLES 2 GAIT/STATION: narrow based gait; ABLE TO WALK TOE, HEEL AND TANDEM; ROMBERG NEG     DIAGNOSTIC DATA (LABS, IMAGING, TESTING) - I reviewed patient records, labs, notes, testing and imaging myself where available.  Lab Results  Component Value Date   WBC 4.8 01/31/2014   HGB 14.0 01/31/2014   HCT 41.4 01/31/2014   MCV 90 01/31/2014   PLT 294 01/31/2014      Component Value Date/Time   NA 141 12/17/2009 1302   K 3.6 12/17/2009 1302   CL 110 12/17/2009 1302   CO2 25 12/17/2009 1302    GLUCOSE 90 12/17/2009 1302   BUN 6 12/17/2009 1302   CREATININE 0.65 12/17/2009 1302   CALCIUM 9.5 12/17/2009 1302   PROT 7.1 01/31/2014 1604   PROT 7.2 12/17/2009 1302   ALBUMIN 4.1 12/17/2009 1302   AST 22 01/31/2014 1604   ALT 22 01/31/2014 1604   ALKPHOS 66 01/31/2014 1604   BILITOT 0.4 01/31/2014 1604   GFRNONAA >60 12/17/2009 1302   GFRAA  12/17/2009 1302    >60        The eGFR has been calculated using the MDRD equation. This calculation has not been validated in all clinical situations. eGFR's persistently <60 mL/min signify possible Chronic Kidney Disease.   Lab Results  Component Value Date   CHOL  12/18/2009    147        ATP III CLASSIFICATION:  <200     mg/dL   Desirable  200-239  mg/dL   Borderline High  >=240    mg/dL   High  HDL 55 12/18/2009   LDLCALC  12/18/2009    80        Total Cholesterol/HDL:CHD Risk Coronary Heart Disease Risk Table                     Men   Women  1/2 Average Risk   3.4   3.3  Average Risk       5.0   4.4  2 X Average Risk   9.6   7.1  3 X Average Risk  23.4   11.0        Use the calculated Patient Ratio above and the CHD Risk Table to determine the patient's CHD Risk.        ATP III CLASSIFICATION (LDL):  <100     mg/dL   Optimal  100-129  mg/dL   Near or Above                    Optimal  130-159  mg/dL   Borderline  160-189  mg/dL   High  >190     mg/dL   Very High   TRIG 58 12/18/2009   CHOLHDL 2.7 12/18/2009   Lab Results  Component Value Date   HGBA1C  12/18/2009    5.3 (NOTE)                                                                       According to the ADA Clinical Practice Recommendations for 2011, when HbA1c is used as a screening test:   >=6.5%   Diagnostic of Diabetes Mellitus           (if abnormal result  is confirmed)  5.7-6.4%   Increased risk of developing Diabetes Mellitus  References:Diagnosis and Classification of Diabetes Mellitus,Diabetes MHDQ,2229,79(GXQJJ 1):S62-S69 and  Standards of Medical Care in         Diabetes - 2011,Diabetes Care,2011,34  (Suppl 1):S11-S61.   No results found for: VITAMINB12 Lab Results  Component Value Date   TSH 3.463 12/18/2009   VIT D, 25-HYDROXY  Date Value Ref Range Status  01/31/2014 39.1 30.0 - 100.0 ng/mL Final    Comment:    Vitamin D deficiency has been defined by the [place] practice guideline as a level of serum 25-OH vitamin D less than 20 ng/mL (1,2). The Endocrine Society went on to further define vitamin D insufficiency as a level between 21 and 29 ng/mL (2). 1. IOM (Institute of Medicine). 2010. Dietary reference    intakes for calcium and D. [place]: The    [employer]. 2. Holick MF, Binkley [place], Bischoff-Ferrari HA, et al.    Evaluation, treatment, and prevention of vitamin D    deficiency: an Endocrine Society clinical practice    guideline. JCEM. 2011 Jul; 96(7):1911-30.   10/23/2013 35.2 30.0 - 100.0 ng/mL Final    Comment:    Vitamin D deficiency has been defined by the [place] practice guideline as a level of serum 25-OH vitamin D less than 20 ng/mL (1,2). The Endocrine Society went on to further define vitamin D insufficiency as a level between 21 and 29 ng/mL (2). 1. IOM (Institute of  Medicine). 2010. Dietary reference    intakes for calcium and D. [place]: The    [employer]. 2. Holick MF, Binkley [place], Bischoff-Ferrari HA, et al.    Evaluation, treatment, and prevention of vitamin D    deficiency: an Endocrine Society clinical practice    guideline. JCEM. 2011 Jul; 96(7):1911-30.   LYMPHOCYTES ABSOLUTE  Date Value Ref Range Status  01/31/2014 0.5* 0.7 - 3.1 x10E3/uL Final  10/23/2013 0.9 0.7 - 3.1 x10E3/uL Final   LYMPHS ABS  Date Value Ref Range Status  12/17/2009 1.9 0.7 - 4.0 K/uL Final    11/05/13 MRI brain (with and without contrast) demonstrating: 1. Multiple  supratentorial and infratentorial chronic demyelinating plaques.  2. No abnormal enhancing lesions 3. No significant change from MRI on 05/05/11.  02/22/14 MRI brain (with and without) demonstrating: 1. Multiple periventricular, subcortical, juxtacortical, right pontine and left medullary chronic demyelinating plaques. Some of these are confluent. Some of these are hypointense on T1 views.  2. There are at least 2 small left fronto-parietal DWI hyperintense lesions (isointense on ADC), without enhancement, may represent subacute demyelinating plaques.  3. No abnormal enhancing lesions. 4. Compared to MRI on 11/05/13, there is a new plaque noted in the left frontal region (series 3 image 84; series 8 image 15) with subacute-chronic features, and correlates with patient's recent exacerbation in Nov 2015. Otherwise no significant change.   02/22/14 MRI cervical spine (with and without) demonstrating: 1. The spinal cord is notable chronic demyelinating plaques at the cervico-medullary junction and C2-3 levels. 2. Disc bulging from C2-3 to C6-7. No spinal stenosis or foraminal narrowing. 3. Compared to MRI 12/18/09, the C2-3 plaque is new. The cervico-medullary plaques have decreased in size.  01/31/14 JCV ab - 2.11 (H)     ASSESSMENT AND PLAN  39 y.o. year old female here with relapsing remitting multiple sclerosis dx'd in 2011. Extensive workup was done in the hospital from October 5-7, 2011.  MRI brain showed multiple intracranial lesions, multiple enhancing lesions, including asymptomatic and symptomatic enhancing lesions.  MRI of the cervical and thoracic spine showed no intrinsic spinal cord lesions. CSF analysis demonstrated greater than 5 oligoclonal bands, with slight elevation in protein.  Extensive testing ruled out other mimicking conditions (HIV, TSH, ANA, hypercoag panel).  Repeat MRI brain on 02/13/10, showed decrease in lesion size and enhancement. Initially on rebif, now on gileyna  since 11/17/10. Then MS exacerbation in Nov 2015, in setting of inconsistent gilenya use in 2015.  MS --> stable Anxiety --> worsening Urinary incont --> worsening  Dx: RRMS  PLAN: 1. Continue gilenya 2. Monitor lymphocyte counts; check CBC today 3. Change sertraline to duloxetine; refer to psychology as well 4. Monitor urinary incont; schedule regular bladder voiding; hold off on pharmacologics for now   Return in about 3 months (around 09/07/2014).    Penni Bombard, MD 4/94/9447, 39:58 AM Certified in Neurology, Neurophysiology and Neuroimaging  [facility] [address], [place], [place] [address] [phone]

## 2014-06-07 NOTE — Patient Instructions (Signed)
I will check blood testing.  Stop sertraline.  Start duloxetine  daily for anxiety.  I will setup psychiatry referral.

## 2014-06-10 ENCOUNTER — Ambulatory Visit: Payer: Medicaid Other | Admitting: Diagnostic Neuroimaging

## 2014-09-03 ENCOUNTER — Telehealth: Payer: Self-pay | Admitting: Diagnostic Neuroimaging

## 2014-09-03 MED ORDER — FINGOLIMOD HCL 0.5 MG PO CAPS: 1.0000 | ORAL_CAPSULE | Freq: Every day | ORAL | Status: DC

## 2014-09-03 NOTE — Telephone Encounter (Signed)
Typically this drug must go through Specialty mail order, however, there is not one listed in chart.  I called the local pharmacy listed in the chart, and the pharmacist verified they are unable to fill this med.  I called back to clarify specialty pharmacy.  Got no answer.  Left message.    If patient calls back, please verify what pharmacy she would like this Rx sent to.  Thank you!

## 2014-09-03 NOTE — Telephone Encounter (Signed)
Patient called back, I relayed information to her and she stated that defeated the whole purpose of calling and hung up.

## 2014-09-03 NOTE — Telephone Encounter (Signed)
Patient did not provide specialty pharm info when she called back.  I called [facility] to see if perhaps they could help, and see what pharmacy she is contracted through from their benefit investigation.  I spoke with Suzette.  She said the last benefit investigation indicated [facility] was who the patient needed to get this Rx from.  I sent the Rx to this pharmacy.  I called the patient back.  Got no answer.  Relayed the info provided by [facility], and advised Rx was sent to [facility].  Asked that she call us back if anything further is needed.

## 2014-09-03 NOTE — Telephone Encounter (Signed)
Patient called and requested refill on Rx. Fingolimod HCl (GILENYA) 0.5 MG CAPS. Please call and advise.

## 2014-09-11 ENCOUNTER — Telehealth: Payer: Self-pay

## 2014-09-11 ENCOUNTER — Ambulatory Visit (INDEPENDENT_AMBULATORY_CARE_PROVIDER_SITE_OTHER): Payer: Self-pay | Admitting: Diagnostic Neuroimaging

## 2014-09-11 ENCOUNTER — Encounter: Payer: Self-pay | Admitting: Diagnostic Neuroimaging

## 2014-09-11 VITALS — BP 114/77 | HR 67 | Ht 62.0 in | Wt 154.0 lb

## 2014-09-11 DIAGNOSIS — G35 Multiple sclerosis: Secondary | ICD-10-CM

## 2014-09-11 NOTE — Progress Notes (Signed)
[facility]  PATIENT: Anne Valenzuela DOB: 07-Aug-1975  REFERRING CLINICIAN:  HISTORY FROM: patient and husband REASON FOR VISIT: follow up   HISTORICAL  CHIEF COMPLAINT:  Chief Complaint  Patient presents with  . MS    rm 8, husband - Lennette Bihari, needs new medication    HISTORY OF PRESENT ILLNESS:   UPDATE 09/11/14: Since last visit, slightly better. Anxiety and urinary issues better with lifesyle changes. Stopped duloxetine (didn;t like side effects). Unfortunately, gilenya coverage stopped due to change in medicaid status. Now off meds x 2 weeks. No new neuro symptoms.   UPDATE 06/07/14: Since last visit, no new neuro symptoms. Anxiety still diff, usually with specific situations. Today she feels calm. She stopped sertraline due to diarrhea, then anxiety worsened. Now back on sertraline. Having some urinary incont (sudden urge).   UPDATE 03/04/14: Since last visit, she feels better. Sertraline helping with depression and anxiety, but causing mild diarrhea. Gilenya is stable. No new neuro events.   UPDATE 01/31/14: Since last visit, is back on gilenya consistently. 2-3 weeks ago, had right hand numb/weak, slurred speech, bladder incontinence. Symptoms now almost resolved for 3 days. Didn't call us b/c she had this appt planned for today. Also with more depression, anxiety.   UPDATE 10/23/13: Patient last seen in clinic during PreferMS trial (last visit 10/25/11). Was getting gilenya through patient assistance. Did not have insurance, and therefore did not follow up in our clinic. Over last 1 year, was "stretching" out gilenya taking it every other day. Now out of meds x 1 week. Now has insurance and trying to get re-established. No new neuro symptoms. Overall doing well. Enrolledi nschool (funeral services program).   UPDATE 11/03/10: Doing well.  Tried TPX and indomethacin for HA, but didn't help.  HA have subsided on their own.   Now interested in PreferMS  trial.  UPDATE 07/31/10: Has been having headaches (right sided, throbbing; no nausea, vomiting, photo or phono), almost daily but usually 1 hour after rebif injections.  tried fioricet with mild relief.  HA are 5/10 in severity.  Denies new numbness, weakness or vision changes.  No infx sxs.  UPDATE 05/15/10: Started on Rebif in end of Dec 2011.  Some injection site reactions.  Doing about the same.  Weakness essentially resolved.  Depression sxs better.  UPDATE 03/03/10: Doing much better.  All prior sxs essentially resolved.  Back to driving.  Vision, strength, bladder fx much better.  We discussed multiple sclerosis treatmets.  UPDATE 01/13/10: 39 year old female with possible MS.  Hosp f/u visit.  No new complaints.  RUE slightly better.  Still with blurred vision, balance diff. Patient's symptoms started in July 2011, with nausea and diagnosis of urinary tract infection. She then had a 20 pound weight loss. The beginning of October 2011, patient developed right arm weakness and clumsiness. She also had fuzzy vision, headache and urge incontinence. She presented to [facility] and was found to have multiple subcortical white matter lesions on CT scan and confirmed on MRI. She had extensive workup with lab testing and spinal. She was treated with IV solumedrol and then oral prednisone.   REVIEW OF SYSTEMS: Full 14 system review of systems performed and notable only for fatigue freq urination decr concentration weaknesss walking diff daytime sleepiness.   ALLERGIES: No Known Allergies  HOME MEDICATIONS: Outpatient Prescriptions Prior to Visit  Medication Sig Dispense Refill  . DULoxetine (CYMBALTA) 30 MG capsule Take 1 capsule (30 mg total) by  mouth daily. 30 capsule 6  . Fingolimod HCl (GILENYA) 0.5 MG CAPS Take 1 capsule (0.5 mg total) by mouth daily. (Patient not taking: Reported on 09/11/2014) 30 capsule 6   No facility-administered medications prior to visit.    PAST MEDICAL  HISTORY: Past Medical History  Diagnosis Date  . MS (multiple sclerosis)     PAST SURGICAL HISTORY: Past Surgical History  Procedure Laterality Date  . No past surgeries      FAMILY HISTORY: Family History  Problem Relation Age of Onset  . Diabetes Mother     SOCIAL HISTORY:  History   Social History  . Marital Status: Married    Spouse Name: N/A  . Number of Children: 1  . Years of Education: HS   Occupational History  .  Other    student   Social History Main Topics  . Smoking status: Never Smoker   . Smokeless tobacco: Never Used  . Alcohol Use: No  . Drug Use: No  . Sexual Activity: Not on file   Other Topics Concern  . Not on file   Social History Narrative   Patient lives at home with her daughter.   Caffeine Use: tea 3 cups daily     PHYSICAL EXAM  Filed Vitals:   09/11/14 0944  BP: 114/77  Pulse: 67  Height: 5' 2"  (1.575 m)  Weight: 154 lb (69.854 kg)    Not recorded      Body mass index is 28.16 kg/(m^2).  GENERAL EXAM: Patient is in no distress; well developed, nourished and groomed; neck is supple  CARDIOVASCULAR: Regular rate and rhythm, no murmurs, no carotid bruits  NEUROLOGIC: MENTAL STATUS: awake, alert, language fluent, comprehension intact, naming intact, fund of knowledge appropriate CRANIAL NERVE: pupils equal and reactive to light, visual fields full to confrontation, extraocular muscles intact, no nystagmus, facial sensation and strength symmetric, hearing intact, palate elevates symmetrically, uvula midline, shoulder shrug symmetric, tongue midline. MOTOR: normal bulk and tone, full strength in the BUE, BLE SENSORY: normal and symmetric to light touch COORDINATION: finger to nose normal REFLEXES: deep tendon reflexes present and symmetric; BUE 2, KNEES 3, ANKLES 2 GAIT/STATION: narrow based gait; ABLE TO WALK ON HEELS AND TANDEM; ROMBERG NEG     DIAGNOSTIC DATA (LABS, IMAGING, TESTING) - I reviewed patient  records, labs, notes, testing and imaging myself where available.  Lab Results  Component Value Date   WBC 4.8 01/31/2014   HGB 14.0 01/31/2014   HCT 41.4 01/31/2014   MCV 90 01/31/2014   PLT 294 01/31/2014      Component Value Date/Time   NA 141 12/17/2009 1302   K 3.6 12/17/2009 1302   CL 110 12/17/2009 1302   CO2 25 12/17/2009 1302   GLUCOSE 90 12/17/2009 1302   BUN 6 12/17/2009 1302   CREATININE 0.65 12/17/2009 1302   CALCIUM 9.5 12/17/2009 1302   PROT 7.1 01/31/2014 1604   PROT 7.2 12/17/2009 1302   ALBUMIN 4.1 12/17/2009 1302   AST 22 01/31/2014 1604   ALT 22 01/31/2014 1604   ALKPHOS 66 01/31/2014 1604   BILITOT 0.4 01/31/2014 1604   GFRNONAA >60 12/17/2009 1302   GFRAA  12/17/2009 1302    >60        The eGFR has been calculated using the MDRD equation. This calculation has not been validated in all clinical situations. eGFR's persistently <60 mL/min signify possible Chronic Kidney Disease.   Lab Results  Component Value Date   CHOL  12/18/2009    147        ATP III CLASSIFICATION:  <200     mg/dL   Desirable  200-239  mg/dL   Borderline High  >=240    mg/dL   High          HDL 55 12/18/2009   LDLCALC  12/18/2009    80        Total Cholesterol/HDL:CHD Risk Coronary Heart Disease Risk Table                     Men   Women  1/2 Average Risk   3.4   3.3  Average Risk       5.0   4.4  2 X Average Risk   9.6   7.1  3 X Average Risk  23.4   11.0        Use the calculated Patient Ratio above and the CHD Risk Table to determine the patient's CHD Risk.        ATP III CLASSIFICATION (LDL):  <100     mg/dL   Optimal  100-129  mg/dL   Near or Above                    Optimal  130-159  mg/dL   Borderline  160-189  mg/dL   High  >190     mg/dL   Very High   TRIG 58 12/18/2009   CHOLHDL 2.7 12/18/2009   Lab Results  Component Value Date   HGBA1C  12/18/2009    5.3 (NOTE)                                                                        According to the ADA Clinical Practice Recommendations for 2011, when HbA1c is used as a screening test:   >=6.5%   Diagnostic of Diabetes Mellitus           (if abnormal result  is confirmed)  5.7-6.4%   Increased risk of developing Diabetes Mellitus  References:Diagnosis and Classification of Diabetes Mellitus,Diabetes UEAV,4098,11(BJYNW 1):S62-S69 and Standards of Medical Care in         Diabetes - 2011,Diabetes Care,2011,34  (Suppl 1):S11-S61.   No results found for: VITAMINB12 Lab Results  Component Value Date   TSH 3.463 12/18/2009   VIT D, 25-HYDROXY  Date Value Ref Range Status  01/31/2014 39.1 30.0 - 100.0 ng/mL Final    Comment:    Vitamin D deficiency has been defined by the [place] practice guideline as a level of serum 25-OH vitamin D less than 20 ng/mL (1,2). The Endocrine Society went on to further define vitamin D insufficiency as a level between 21 and 29 ng/mL (2). 1. IOM (Institute of Medicine). 2010. Dietary reference    intakes for calcium and D. [place]: The    [employer]. 2. Holick MF, Binkley [place], Bischoff-Ferrari HA, et al.    Evaluation, treatment, and prevention of vitamin D    deficiency: an Endocrine Society clinical practice    guideline. JCEM. 2011 Jul; 96(7):1911-30.   10/23/2013 35.2 30.0 - 100.0 ng/mL Final    Comment:    Vitamin D deficiency  has been defined by the [place] practice guideline as a level of serum 25-OH vitamin D less than 20 ng/mL (1,2). The Endocrine Society went on to further define vitamin D insufficiency as a level between 21 and 29 ng/mL (2). 1. IOM (Institute of Medicine). 2010. Dietary reference    intakes for calcium and D. [place]: The    [employer]. 2. Holick MF, Binkley [place], Bischoff-Ferrari HA, et al.    Evaluation, treatment, and prevention of vitamin D    deficiency: an Endocrine Society clinical  practice    guideline. JCEM. 2011 Jul; 96(7):1911-30.   LYMPHOCYTES ABSOLUTE  Date Value Ref Range Status  01/31/2014 0.5* 0.7 - 3.1 x10E3/uL Final  10/23/2013 0.9 0.7 - 3.1 x10E3/uL Final   LYMPHS ABS  Date Value Ref Range Status  12/17/2009 1.9 0.7 - 4.0 K/uL Final    11/05/13 MRI brain (with and without contrast) demonstrating: 1. Multiple supratentorial and infratentorial chronic demyelinating plaques.  2. No abnormal enhancing lesions 3. No significant change from MRI on 05/05/11.  02/22/14 MRI brain (with and without) demonstrating: 1. Multiple periventricular, subcortical, juxtacortical, right pontine and left medullary chronic demyelinating plaques. Some of these are confluent. Some of these are hypointense on T1 views.  2. There are at least 2 small left fronto-parietal DWI hyperintense lesions (isointense on ADC), without enhancement, may represent subacute demyelinating plaques.  3. No abnormal enhancing lesions. 4. Compared to MRI on 11/05/13, there is a new plaque noted in the left frontal region (series 3 image 84; series 8 image 15) with subacute-chronic features, and correlates with patient's recent exacerbation in Nov 2015. Otherwise no significant change.   02/22/14 MRI cervical spine (with and without) demonstrating: 1. The spinal cord is notable chronic demyelinating plaques at the cervico-medullary junction and C2-3 levels. 2. Disc bulging from C2-3 to C6-7. No spinal stenosis or foraminal narrowing. 3. Compared to MRI 12/18/09, the C2-3 plaque is new. The cervico-medullary plaques have decreased in size.  01/31/14 JCV ab - 2.11 (H)     ASSESSMENT AND PLAN  39 y.o. year old female here with relapsing remitting multiple sclerosis dx'd in 2011. Extensive workup was done in the hospital from October 5-7, 2011.  MRI brain showed multiple intracranial lesions, multiple enhancing lesions, including asymptomatic and symptomatic enhancing lesions.  MRI of the cervical  and thoracic spine showed no intrinsic spinal cord lesions. CSF analysis demonstrated greater than 5 oligoclonal bands, with slight elevation in protein.  Extensive testing ruled out other mimicking conditions (HIV, TSH, ANA, hypercoag panel).  Repeat MRI brain on 02/13/10, showed decrease in lesion size and enhancement. Initially on rebif, now on gileyna since 11/17/10. Then MS exacerbation in Nov 2015, in setting of inconsistent gilenya use in 2015.  MS --> stable Anxiety --> slightly better Urinary incont --> slightly better  Dx: RRMS  PLAN: 1. Restart gilenya (will contact [facility] re: medicaid coverage); patient will need another FDO setup; I asked Norva Pavlov (pharm tech) to help [place] with this 2. Baseline MRI and labs before restarting gilenya 3. Monitor anxiety and urinary issues; hold off on pharmacologics for now   Return in about 3 months (around 12/12/2014).    Penni Bombard, MD 03/24/3157, 45:85 AM Certified in Neurology, Neurophysiology and Neuroimaging  [facility] [address], [place], [place] [address] [phone]

## 2014-09-11 NOTE — Telephone Encounter (Signed)
I called [facility] at [phone].  Spoke with [employer].  Explained the patient had a lapse/change in ins and she has been without medication for 2 weeks, indicating when she contacted [facility] they were unable to provide meds.  She reviewed the patient's file and says they have no record of the patient contacting them.  (She reviewed file for past 3 months).  Says they could certainly send her a bridge supply, that would be no problem.  She offered to go ahead and send a temp supply to the patient today, but I advised the provider is initiating a new FDO due to lapse in meds.  She understood, and said if we change our minds and decide a bridge dose is needed, we can call back and they will be happy to send meds to the patient.

## 2014-09-25 ENCOUNTER — Telehealth: Payer: Self-pay | Admitting: Diagnostic Neuroimaging

## 2014-09-25 NOTE — Telephone Encounter (Signed)
Vicki(RN) with [facility] inquiring if patient is medically cleared for 1st dose of FDO? Please call and advise. She can be reached at [phone].

## 2014-09-25 NOTE — Telephone Encounter (Signed)
Go ahead with FDO. -VRP

## 2014-09-26 NOTE — Telephone Encounter (Signed)
Spoke with Chip Boer RN with [facility] and notified her that per Dr Marjory Lies, Ms Jeppsen is medically cleared for 1st dose of FDO. She verbalized understanding, stated she would try to get in touch with pt today and schedule.

## 2014-10-04 ENCOUNTER — Inpatient Hospital Stay: Admission: RE | Admit: 2014-10-04 | Payer: Medicaid Other | Source: Ambulatory Visit

## 2014-10-17 ENCOUNTER — Encounter (INDEPENDENT_AMBULATORY_CARE_PROVIDER_SITE_OTHER): Payer: Medicaid Other | Admitting: Diagnostic Neuroimaging

## 2014-10-17 ENCOUNTER — Ambulatory Visit
Admission: RE | Admit: 2014-10-17 | Discharge: 2014-10-17 | Disposition: A | Discharge status: Home / Self Care | Payer: Medicaid Other | Source: Ambulatory Visit | Attending: Diagnostic Neuroimaging | Admitting: Diagnostic Neuroimaging

## 2014-10-17 DIAGNOSIS — G35 Multiple sclerosis: Secondary | ICD-10-CM

## 2014-10-17 MED ORDER — GADOBENATE DIMEGLUMINE 529 MG/ML IV SOLN
14.0000 mL | Freq: Once | INTRAVENOUS | Status: AC | PRN
  Administered 2014-10-17: 14 mL via INTRAVENOUS

## 2014-10-21 ENCOUNTER — Telehealth: Payer: Self-pay | Admitting: *Deleted

## 2014-10-21 NOTE — Telephone Encounter (Signed)
Left vm re: per Dr Marjory Lies, her MRI results are stable, dr will continue with current treatment plan for her. Left this caller's name, number for further questions.

## 2014-10-21 NOTE — Telephone Encounter (Signed)
-----   Message from Suanne Marker, MD sent at 10/21/2014  8:44 AM EDT ----- pls call with stable MRI. Continue current plan. -VRP

## 2014-11-13 ENCOUNTER — Other Ambulatory Visit (INDEPENDENT_AMBULATORY_CARE_PROVIDER_SITE_OTHER): Payer: Self-pay

## 2014-11-13 DIAGNOSIS — Z0289 Encounter for other administrative examinations: Secondary | ICD-10-CM

## 2014-11-13 DIAGNOSIS — G35 Multiple sclerosis: Secondary | ICD-10-CM

## 2014-11-14 LAB — CBC WITH DIFFERENTIAL/PLATELET
BASOS ABS: 0 10*3/uL (ref 0.0–0.2)
Basos: 0 %
EOS (ABSOLUTE): 0.1 10*3/uL (ref 0.0–0.4)
EOS: 2 %
HEMATOCRIT: 41.1 % (ref 34.0–46.6)
HEMOGLOBIN: 13.4 g/dL (ref 11.1–15.9)
IMMATURE GRANS (ABS): 0 10*3/uL (ref 0.0–0.1)
IMMATURE GRANULOCYTES: 0 %
LYMPHS ABS: 0.8 10*3/uL (ref 0.7–3.1)
LYMPHS: 12 %
MCH: 30.9 pg (ref 26.6–33.0)
MCHC: 32.6 g/dL (ref 31.5–35.7)
MCV: 95 fL (ref 79–97)
MONOCYTES: 8 %
Monocytes Absolute: 0.5 10*3/uL (ref 0.1–0.9)
Neutrophils Absolute: 5 10*3/uL (ref 1.4–7.0)
Neutrophils: 78 %
Platelets: 290 10*3/uL (ref 150–379)
RBC: 4.34 x10E6/uL (ref 3.77–5.28)
RDW: 13.1 % (ref 12.3–15.4)
WBC: 6.5 10*3/uL (ref 3.4–10.8)

## 2014-12-16 ENCOUNTER — Encounter: Payer: Self-pay | Admitting: Diagnostic Neuroimaging

## 2014-12-16 ENCOUNTER — Ambulatory Visit (INDEPENDENT_AMBULATORY_CARE_PROVIDER_SITE_OTHER): Payer: Medicaid Other | Admitting: Diagnostic Neuroimaging

## 2014-12-16 VITALS — BP 111/68 | HR 65 | Ht 62.0 in | Wt 165.2 lb

## 2014-12-16 DIAGNOSIS — G35 Multiple sclerosis: Secondary | ICD-10-CM

## 2014-12-16 DIAGNOSIS — G35D Multiple sclerosis, unspecified: Secondary | ICD-10-CM

## 2014-12-16 NOTE — Progress Notes (Signed)
[facility]  PATIENT: MERCADES Valenzuela DOB: 01-Jan-1976  REFERRING CLINICIAN:  HISTORY FROM: patient REASON FOR VISIT: follow up   HISTORICAL  CHIEF COMPLAINT:  Chief Complaint  Patient presents with  . Multiple sclerosis    rm 6  . Follow-up    3 month    HISTORY OF PRESENT ILLNESS:   UPDATE 10/3/16L Since last visit, doing well. Back on gilenya since aug 2016. Anxiety, bladder and gati issues are stable. Overall stable, and satisfied with medication mgmt. Does not want to add any new meds to her regimen.  UPDATE 09/11/14: Since last visit, slightly better. Anxiety and urinary issues better with lifesyle changes. Stopped duloxetine (didn't like side effects). Unfortunately, gilenya coverage stopped due to change in medicaid status. Now off meds x 2 weeks. No new neuro symptoms.   UPDATE 06/07/14: Since last visit, no new neuro symptoms. Anxiety still diff, usually with specific situations. Today she feels calm. She stopped sertraline due to diarrhea, then anxiety worsened. Now back on sertraline. Having some urinary incont (sudden urge).   UPDATE 03/04/14: Since last visit, she feels better. Sertraline helping with depression and anxiety, but causing mild diarrhea. Gilenya is stable. No new neuro events.   UPDATE 01/31/14: Since last visit, is back on gilenya consistently. 2-3 weeks ago, had right hand numb/weak, slurred speech, bladder incontinence. Symptoms now almost resolved for 3 days. Didn't call us b/c she had this appt planned for today. Also with more depression, anxiety.   UPDATE 10/23/13: Patient last seen in clinic during [facility] trial (last visit 10/25/11). Was getting gilenya through patient assistance. Did not have insurance, and therefore did not follow up in our clinic. Over last 1 year, was "stretching" out gilenya taking it every other day. Now out of meds x 1 week. Now has insurance and trying to get re-established. No new neuro symptoms.  Overall doing well. Enrolledi nschool (funeral services program).   UPDATE 11/03/10: Doing well.  Tried TPX and indomethacin for HA, but didn't help.  HA have subsided on their own.   Now interested in PreferMS trial.  UPDATE 07/31/10: Has been having headaches (right sided, throbbing; no nausea, vomiting, photo or phono), almost daily but usually 1 hour after rebif injections.  tried fioricet with mild relief.  HA are 5/10 in severity.  Denies new numbness, weakness or vision changes.  No infx sxs.  UPDATE 05/15/10: Started on Rebif in end of Dec 2011.  Some injection site reactions.  Doing about the same.  Weakness essentially resolved.  Depression sxs better.  UPDATE 03/03/10: Doing much better.  All prior sxs essentially resolved.  Back to driving.  Vision, strength, bladder fx much better.  We discussed multiple sclerosis treatmets.  UPDATE 01/13/10: 39 year old female with possible MS.  Hosp f/u visit.  No new complaints.  RUE slightly better.  Still with blurred vision, balance diff. Patient's symptoms started in July 2011, with nausea and diagnosis of urinary tract infection. She then had a 20 pound weight loss. The beginning of October 2011, patient developed right arm weakness and clumsiness. She also had fuzzy vision, headache and urge incontinence. She presented to [facility] and was found to have multiple subcortical white matter lesions on CT scan and confirmed on MRI. She had extensive workup with lab testing and spinal. She was treated with IV solumedrol and then oral prednisone.   REVIEW OF SYSTEMS: Full 14 system review of systems performed and notable only for fatigue freq urination  decr concentration weaknesss walking diff daytime sleepiness.   ALLERGIES: No Known Allergies  HOME MEDICATIONS: Outpatient Prescriptions Prior to Visit  Medication Sig Dispense Refill  . Multiple Vitamin (MULTIVITAMIN) tablet Take 1 tablet by mouth daily.     No facility-administered medications  prior to visit.    PAST MEDICAL HISTORY: Past Medical History  Diagnosis Date  . MS (multiple sclerosis) ([place])     PAST SURGICAL HISTORY: Past Surgical History  Procedure Laterality Date  . No past surgeries      FAMILY HISTORY: Family History  Problem Relation Age of Onset  . Diabetes Mother     SOCIAL HISTORY:  Social History   Social History  . Marital Status: Married    Spouse Name: N/A  . Number of Children: 1  . Years of Education: HS   Occupational History  .  Other    student   Social History Main Topics  . Smoking status: Never Smoker   . Smokeless tobacco: Never Used  . Alcohol Use: No  . Drug Use: No  . Sexual Activity: Not on file   Other Topics Concern  . Not on file   Social History Narrative   Patient lives at home with her daughter.   Caffeine Use: tea 3 cups daily     PHYSICAL EXAM  Filed Vitals:   12/16/14 0852  BP: 111/68  Pulse: 65  Height: 5' 2"  (1.575 m)  Weight: 165 lb 3.2 oz (74.934 kg)    Not recorded      Body mass index is 30.21 kg/(m^2).  GENERAL EXAM: Patient is in no distress; well developed, nourished and groomed; neck is supple  CARDIOVASCULAR: Regular rate and rhythm, no murmurs, no carotid bruits  NEUROLOGIC: MENTAL STATUS: awake, alert, language fluent, comprehension intact, naming intact, fund of knowledge appropriate CRANIAL NERVE: pupils equal and reactive to light, visual fields full to confrontation, extraocular muscles intact, no nystagmus, facial sensation and strength symmetric, hearing intact, palate elevates symmetrically, uvula midline, shoulder shrug symmetric, tongue midline. MOTOR: normal bulk and tone, full strength in the BUE, BLE SENSORY: normal and symmetric to light touch COORDINATION: finger to nose normal REFLEXES: deep tendon reflexes present and symmetric; BUE 2, KNEES 3, ANKLES 2 GAIT/STATION: narrow based gait; ROMBERG NEG     DIAGNOSTIC DATA (LABS, IMAGING, TESTING) - I  reviewed patient records, labs, notes, testing and imaging myself where available.  Lab Results  Component Value Date   WBC 6.5 11/13/2014   HGB 14.0 01/31/2014   HCT 41.1 11/13/2014   MCV 90 01/31/2014   PLT 294 01/31/2014      Component Value Date/Time   NA 141 12/17/2009 1302   K 3.6 12/17/2009 1302   CL 110 12/17/2009 1302   CO2 25 12/17/2009 1302   GLUCOSE 90 12/17/2009 1302   BUN 6 12/17/2009 1302   CREATININE 0.65 12/17/2009 1302   CALCIUM 9.5 12/17/2009 1302   PROT 7.1 01/31/2014 1604   PROT 7.2 12/17/2009 1302   ALBUMIN 4.1 12/17/2009 1302   AST 22 01/31/2014 1604   ALT 22 01/31/2014 1604   ALKPHOS 66 01/31/2014 1604   BILITOT 0.4 01/31/2014 1604   GFRNONAA >60 12/17/2009 1302   GFRAA  12/17/2009 1302    >60        The eGFR has been calculated using the MDRD equation. This calculation has not been validated in all clinical situations. eGFR's persistently <60 mL/min signify possible Chronic Kidney Disease.   Lab Results  Component  Value Date   CHOL  12/18/2009    147        ATP III CLASSIFICATION:  <200     mg/dL   Desirable  200-239  mg/dL   Borderline High  >=240    mg/dL   High          HDL 55 12/18/2009   LDLCALC  12/18/2009    80        Total Cholesterol/HDL:CHD Risk Coronary Heart Disease Risk Table                     Men   Women  1/2 Average Risk   3.4   3.3  Average Risk       5.0   4.4  2 X Average Risk   9.6   7.1  3 X Average Risk  23.4   11.0        Use the calculated Patient Ratio above and the CHD Risk Table to determine the patient's CHD Risk.        ATP III CLASSIFICATION (LDL):  <100     mg/dL   Optimal  100-129  mg/dL   Near or Above                    Optimal  130-159  mg/dL   Borderline  160-189  mg/dL   High  >190     mg/dL   Very High   TRIG 58 12/18/2009   CHOLHDL 2.7 12/18/2009   Lab Results  Component Value Date   HGBA1C  12/18/2009    5.3 (NOTE)                                                                        According to the ADA Clinical Practice Recommendations for 2011, when HbA1c is used as a screening test:   >=6.5%   Diagnostic of Diabetes Mellitus           (if abnormal result  is confirmed)  5.7-6.4%   Increased risk of developing Diabetes Mellitus  References:Diagnosis and Classification of Diabetes Mellitus,Diabetes ZHYQ,6578,46(NGEXB 1):S62-S69 and Standards of Medical Care in         Diabetes - 2011,Diabetes Care,2011,34  (Suppl 1):S11-S61.   No results found for: VITAMINB12 Lab Results  Component Value Date   TSH 3.463 12/18/2009   VIT D, 25-HYDROXY  Date Value Ref Range Status  01/31/2014 39.1 30.0 - 100.0 ng/mL Final    Comment:    Vitamin D deficiency has been defined by the [place] practice guideline as a level of serum 25-OH vitamin D less than 20 ng/mL (1,2). The Endocrine Society went on to further define vitamin D insufficiency as a level between 21 and 29 ng/mL (2). 1. IOM (Institute of Medicine). 2010. Dietary reference    intakes for calcium and D. [place]: The    [employer]. 2. Holick MF, Binkley [place], Bischoff-Ferrari HA, et al.    Evaluation, treatment, and prevention of vitamin D    deficiency: an Endocrine Society clinical practice    guideline. JCEM. 2011 Jul; 96(7):1911-30.   10/23/2013 35.2 30.0 - 100.0 ng/mL Final    Comment:  Vitamin D deficiency has been defined by the [place] practice guideline as a level of serum 25-OH vitamin D less than 20 ng/mL (1,2). The Endocrine Society went on to further define vitamin D insufficiency as a level between 21 and 29 ng/mL (2). 1. IOM (Institute of Medicine). 2010. Dietary reference    intakes for calcium and D. [place]: The    [employer]. 2. Holick MF, Binkley [place], Bischoff-Ferrari HA, et al.    Evaluation, treatment, and prevention of vitamin D    deficiency: an Endocrine Society  clinical practice    guideline. JCEM. 2011 Jul; 96(7):1911-30.   LYMPHOCYTES ABSOLUTE  Date Value Ref Range Status  11/13/2014 0.8 0.7 - 3.1 x10E3/uL Final  01/31/2014 0.5* 0.7 - 3.1 x10E3/uL Final  10/23/2013 0.9 0.7 - 3.1 x10E3/uL Final   LYMPHS ABS  Date Value Ref Range Status  12/17/2009 1.9 0.7 - 4.0 K/uL Final    11/05/13 MRI brain (with and without contrast) demonstrating: 1. Multiple supratentorial and infratentorial chronic demyelinating plaques.  2. No abnormal enhancing lesions 3. No significant change from MRI on 05/05/11.  02/22/14 MRI brain (with and without) demonstrating: 1. Multiple periventricular, subcortical, juxtacortical, right pontine and left medullary chronic demyelinating plaques. Some of these are confluent. Some of these are hypointense on T1 views.  2. There are at least 2 small left fronto-parietal DWI hyperintense lesions (isointense on ADC), without enhancement, may represent subacute demyelinating plaques.  3. No abnormal enhancing lesions. 4. Compared to MRI on 11/05/13, there is a new plaque noted in the left frontal region (series 3 image 84; series 8 image 15) with subacute-chronic features, and correlates with patient's recent exacerbation in Nov 2015. Otherwise no significant change.   02/22/14 MRI cervical spine (with and without) demonstrating: 1. The spinal cord is notable chronic demyelinating plaques at the cervico-medullary junction and C2-3 levels. 2. Disc bulging from C2-3 to C6-7. No spinal stenosis or foraminal narrowing. 3. Compared to MRI 12/18/09, the C2-3 plaque is new. The cervico-medullary plaques have decreased in size.  01/31/14 JCV ab - 2.11 (H) positive  10/17/14 MRI brain (with and without) demonstrating: 1. Multiple supratentorial and infratentorial chronic demyelinating plaques. 2. No acute plaques. 3. Compared to prior MRI on 02/22/14, there are no significant new findings, and resolution of prior DWI hyperintensities.      ASSESSMENT AND PLAN  39 y.o. year old female here with relapsing remitting multiple sclerosis dx'd in 2011. Extensive workup was done in the hospital from October 5-7, 2011.  MRI brain showed multiple intracranial lesions, multiple enhancing lesions, including asymptomatic and symptomatic enhancing lesions.  MRI of the cervical and thoracic spine showed no intrinsic spinal cord lesions. CSF analysis demonstrated greater than 5 oligoclonal bands, with slight elevation in protein.  Extensive testing ruled out other mimicking conditions (HIV, TSH, ANA, hypercoag panel).  Repeat MRI brain on 02/13/10, showed decrease in lesion size and enhancement. Initially on rebif, now on gileyna since 11/17/10. Then MS exacerbation in Nov 2015, in setting of inconsistent gilenya use in 2015. Then off gilenya in June 2016 due to insurance issues. Now back on gilenya since Aug 2016.  MS --> stable Anxiety --> slightly better Urinary incont --> stable   Dx: RRMS   PLAN: I spent 15 minutes of face to face time with patient. Greater than 50% of time was spent in counseling and coordination of care with patient. In summary we discussed:  1. Continue gilenya  2. Monitor anxiety and urinary issues; hold off on pharmacologics for now 3. Labs at next visit (CBC, Vit D)   Return in about 3 months (around 03/18/2015).    Penni Bombard, MD 21/11/7586, 3:25 AM Certified in Neurology, Neurophysiology and Neuroimaging  [facility] [address] [place],  [address] [phone]

## 2014-12-16 NOTE — Patient Instructions (Signed)

## 2015-04-04 ENCOUNTER — Encounter: Payer: Self-pay | Admitting: Diagnostic Neuroimaging

## 2015-04-04 ENCOUNTER — Ambulatory Visit (INDEPENDENT_AMBULATORY_CARE_PROVIDER_SITE_OTHER): Payer: Medicaid Other | Admitting: Diagnostic Neuroimaging

## 2015-04-04 VITALS — BP 125/77 | HR 73 | Ht 62.0 in | Wt 161.2 lb

## 2015-04-04 DIAGNOSIS — G35 Multiple sclerosis: Secondary | ICD-10-CM

## 2015-04-04 NOTE — Patient Instructions (Signed)
-   I will check labs today - continue gilenya

## 2015-04-04 NOTE — Progress Notes (Signed)
[facility]  PATIENT: Anne Valenzuela DOB: 11/14/1975  REFERRING CLINICIAN:  HISTORY FROM: patient REASON FOR VISIT: follow up   HISTORICAL  CHIEF COMPLAINT:  Chief Complaint  Patient presents with  . Multiple sclerosis    rm 6, 01/2014 JCV 2.11, 10/2014 MRI brain, "doing well"  . Follow-up    3 month    HISTORY OF PRESENT ILLNESS:   UPDATE 04/04/15: Doing well. Tolerating gilenya and multi-vitamin daily. No new issues. Mood, gait, bladder issues stable.   UPDATE 12/16/14: Since last visit, doing well. Back on gilenya since aug 2016. Anxiety, bladder and gait issues are stable. Overall stable, and satisfied with medication mgmt. Does not want to add any new meds to her regimen.  UPDATE 09/11/14: Since last visit, slightly better. Anxiety and urinary issues better with lifesyle changes. Stopped duloxetine (didn't like side effects). Unfortunately, gilenya coverage stopped due to change in medicaid status. Now off meds x 2 weeks. No new neuro symptoms.   UPDATE 06/07/14: Since last visit, no new neuro symptoms. Anxiety still diff, usually with specific situations. Today she feels calm. She stopped sertraline due to diarrhea, then anxiety worsened. Now back on sertraline. Having some urinary incont (sudden urge).   UPDATE 03/04/14: Since last visit, she feels better. Sertraline helping with depression and anxiety, but causing mild diarrhea. Gilenya is stable. No new neuro events.   UPDATE 01/31/14: Since last visit, is back on gilenya consistently. 2-3 weeks ago, had right hand numb/weak, slurred speech, bladder incontinence. Symptoms now almost resolved for 3 days. Didn't call us b/c she had this appt planned for today. Also with more depression, anxiety.   UPDATE 10/23/13: Patient last seen in clinic during [facility] trial (last visit 10/25/11). Was getting gilenya through patient assistance. Did not have insurance, and therefore did not follow up in our clinic. Over  last 1 year, was "stretching" out gilenya taking it every other day. Now out of meds x 1 week. Now has insurance and trying to get re-established. No new neuro symptoms. Overall doing well. Enrolledi nschool (funeral services program).   UPDATE 11/03/10: Doing well.  Tried TPX and indomethacin for HA, but didn't help.  HA have subsided on their own.   Now interested in PreferMS trial.  UPDATE 07/31/10: Has been having headaches (right sided, throbbing; no nausea, vomiting, photo or phono), almost daily but usually 1 hour after rebif injections.  tried fioricet with mild relief.  HA are 5/10 in severity.  Denies new numbness, weakness or vision changes.  No infx sxs.  UPDATE 05/15/10: Started on Rebif in end of Dec 2011.  Some injection site reactions.  Doing about the same.  Weakness essentially resolved.  Depression sxs better.  UPDATE 03/03/10: Doing much better.  All prior sxs essentially resolved.  Back to driving.  Vision, strength, bladder fx much better.  We discussed multiple sclerosis treatmets.  UPDATE 01/13/10: 41 year old female with possible MS.  Hosp f/u visit.  No new complaints.  RUE slightly better.  Still with blurred vision, balance diff. Patient's symptoms started in July 2011, with nausea and diagnosis of urinary tract infection. She then had a 20 pound weight loss. The beginning of October 2011, patient developed right arm weakness and clumsiness. She also had fuzzy vision, headache and urge incontinence. She [facility] and was found to have multiple subcortical white matter lesions on CT scan and confirmed on MRI. She had extensive workup with lab testing and spinal. She was  treated with IV solumedrol and then oral prednisone.   REVIEW OF SYSTEMS: Full 14 system review of systems performed and notable only for incont bladder freq urination memory loss speech diff decr concentration.   ALLERGIES: No Known Allergies  HOME MEDICATIONS: Outpatient Prescriptions Prior  to Visit  Medication Sig Dispense Refill  . Fingolimod HCl (GILENYA) 0.5 MG CAPS Take 1 capsule by mouth daily.    . Multiple Vitamin (MULTIVITAMIN) tablet Take 1 tablet by mouth daily.     No facility-administered medications prior to visit.    PAST MEDICAL HISTORY: Past Medical History  Diagnosis Date  . MS (multiple sclerosis) ([place])     PAST SURGICAL HISTORY: Past Surgical History  Procedure Laterality Date  . No past surgeries      FAMILY HISTORY: Family History  Problem Relation Age of Onset  . Diabetes Mother     SOCIAL HISTORY:  Social History   Social History  . Marital Status: Married    Spouse Name: N/A  . Number of Children: 1  . Years of Education: HS   Occupational History  .  Other    student   Social History Main Topics  . Smoking status: Never Smoker   . Smokeless tobacco: Never Used  . Alcohol Use: No  . Drug Use: No  . Sexual Activity: Not on file   Other Topics Concern  . Not on file   Social History Narrative   Patient lives at home with her daughter.   Caffeine Use: tea 3 cups daily     PHYSICAL EXAM  Filed Vitals:   04/04/15 0951  BP: 125/77  Pulse: 73  Height: 5' 2"  (1.575 m)  Weight: 161 lb 3.2 oz (73.12 kg)    Not recorded      Body mass index is 29.48 kg/(m^2).  GENERAL EXAM: Patient is in no distress; well developed, nourished and groomed; neck is supple  CARDIOVASCULAR: Regular rate and rhythm, no murmurs, no carotid bruits  NEUROLOGIC: MENTAL STATUS: awake, alert, language fluent, comprehension intact, naming intact, fund of knowledge appropriate CRANIAL NERVE: pupils equal and reactive to light, visual fields full to confrontation, extraocular muscles intact, no nystagmus, facial sensation and strength symmetric, hearing intact, palate elevates symmetrically, uvula midline, shoulder shrug symmetric, tongue midline. MOTOR: normal bulk and tone, full strength in the BUE, BLE SENSORY: normal and symmetric to  light touch COORDINATION: finger to nose normal REFLEXES: deep tendon reflexes present and symmetric; BUE 2, KNEES 3, ANKLES 2 GAIT/STATION: narrow based gait; ABLE TO TANDEM; ROMBERG NEG     DIAGNOSTIC DATA (LABS, IMAGING, TESTING) - I reviewed patient records, labs, notes, testing and imaging myself where available.  Lab Results  Component Value Date   WBC 6.5 11/13/2014   HGB 14.0 01/31/2014   HCT 41.1 11/13/2014   MCV 95 11/13/2014   PLT 290 11/13/2014      Component Value Date/Time   NA 141 12/17/2009 1302   K 3.6 12/17/2009 1302   CL 110 12/17/2009 1302   CO2 25 12/17/2009 1302   GLUCOSE 90 12/17/2009 1302   BUN 6 12/17/2009 1302   CREATININE 0.65 12/17/2009 1302   CALCIUM 9.5 12/17/2009 1302   PROT 7.1 01/31/2014 1604   PROT 7.2 12/17/2009 1302   ALBUMIN 4.8 01/31/2014 1604   ALBUMIN 4.1 12/17/2009 1302   AST 22 01/31/2014 1604   ALT 22 01/31/2014 1604   ALKPHOS 66 01/31/2014 1604   BILITOT 0.4 01/31/2014 1604   GFRNONAA >60 12/17/2009  [place]  12/17/2009 1302    >60        The eGFR has been calculated using the MDRD equation. This calculation has not been validated in all clinical situations. eGFR's persistently <60 mL/min signify possible Chronic Kidney Disease.   Lab Results  Component Value Date   CHOL  12/18/2009    147        ATP III CLASSIFICATION:  <200     mg/dL   Desirable  200-239  mg/dL   Borderline High  >=240    mg/dL   High          HDL 55 12/18/2009   LDLCALC  12/18/2009    80        Total Cholesterol/HDL:CHD Risk Coronary Heart Disease Risk Table                     Men   Women  1/2 Average Risk   3.4   3.3  Average Risk       5.0   4.4  2 X Average Risk   9.6   7.1  3 X Average Risk  23.4   11.0        Use the calculated Patient Ratio above and the CHD Risk Table to determine the patient's CHD Risk.        ATP III CLASSIFICATION (LDL):  <100     mg/dL   Optimal  100-129  mg/dL   Near or Above                     Optimal  130-159  mg/dL   Borderline  160-189  mg/dL   High  >190     mg/dL   Very High   TRIG 58 12/18/2009   CHOLHDL 2.7 12/18/2009   Lab Results  Component Value Date   HGBA1C  12/18/2009    5.3 (NOTE)                                                                       According to the ADA Clinical Practice Recommendations for 2011, when HbA1c is used as a screening test:   >=6.5%   Diagnostic of Diabetes Mellitus           (if abnormal result  is confirmed)  5.7-6.4%   Increased risk of developing Diabetes Mellitus  References:Diagnosis and Classification of Diabetes Mellitus,Diabetes YEMV,3612,24(SLPNP 1):S62-S69 and Standards of Medical Care in         Diabetes - 2011,Diabetes Care,2011,34  (Suppl 1):S11-S61.   No results found for: VITAMINB12 Lab Results  Component Value Date   TSH 3.463 12/18/2009   VIT D, 25-HYDROXY  Date Value Ref Range Status  01/31/2014 39.1 30.0 - 100.0 ng/mL Final    Comment:    Vitamin D deficiency has been defined by the [place] practice guideline as a level of serum 25-OH vitamin D less than 20 ng/mL (1,2). The Endocrine Society went on to further define vitamin D insufficiency as a level between 21 and 29 ng/mL (2). 1. IOM (Institute of Medicine). 2010. Dietary reference    intakes for calcium and D. [place]: The  [employer]. 2. Holick MF, Binkley Gregg, Bischoff-Ferrari HA, et al.    Evaluation, treatment, and prevention of vitamin D    deficiency: an Endocrine Society clinical practice    guideline. JCEM. 2011 Jul; 96(7):1911-30.   10/23/2013 35.2 30.0 - 100.0 ng/mL Final    Comment:    Vitamin D deficiency has been defined by the [place] practice guideline as a level of serum 25-OH vitamin D less than 20 ng/mL (1,2). The Endocrine Society went on to further define vitamin D insufficiency as a level between 21 and 29 ng/mL (2). 1. IOM  (Institute of Medicine). 2010. Dietary reference    intakes for calcium and D. [place]: The    [employer]. 2. Holick MF, Binkley [place], Bischoff-Ferrari HA, et al.    Evaluation, treatment, and prevention of vitamin D    deficiency: an Endocrine Society clinical practice    guideline. JCEM. 2011 Jul; 96(7):1911-30.   LYMPHOCYTES ABSOLUTE  Date Value Ref Range Status  11/13/2014 0.8 0.7 - 3.1 x10E3/uL Final  01/31/2014 0.5* 0.7 - 3.1 x10E3/uL Final  10/23/2013 0.9 0.7 - 3.1 x10E3/uL Final   LYMPHS ABS  Date Value Ref Range Status  12/17/2009 1.9 0.7 - 4.0 K/uL Final    11/05/13 MRI brain (with and without contrast) demonstrating: 1. Multiple supratentorial and infratentorial chronic demyelinating plaques.  2. No abnormal enhancing lesions 3. No significant change from MRI on 05/05/11.  02/22/14 MRI brain (with and without) demonstrating: 1. Multiple periventricular, subcortical, juxtacortical, right pontine and left medullary chronic demyelinating plaques. Some of these are confluent. Some of these are hypointense on T1 views.  2. There are at least 2 small left fronto-parietal DWI hyperintense lesions (isointense on ADC), without enhancement, may represent subacute demyelinating plaques.  3. No abnormal enhancing lesions. 4. Compared to MRI on 11/05/13, there is a new plaque noted in the left frontal region (series 3 image 84; series 8 image 15) with subacute-chronic features, and correlates with patient's recent exacerbation in Nov 2015. Otherwise no significant change.   02/22/14 MRI cervical spine (with and without) demonstrating: 1. The spinal cord is notable chronic demyelinating plaques at the cervico-medullary junction and C2-3 levels. 2. Disc bulging from C2-3 to C6-7. No spinal stenosis or foraminal narrowing. 3. Compared to MRI 12/18/09, the C2-3 plaque is new. The cervico-medullary plaques have decreased in size.  01/31/14 JCV ab - 2.11 (H)  positive  10/17/14 MRI brain (with and without) demonstrating: 1. Multiple supratentorial and infratentorial chronic demyelinating plaques. 2. No acute plaques. 3. Compared to prior MRI on 02/22/14, there are no significant new findings, and resolution of prior DWI hyperintensities.     ASSESSMENT AND PLAN  40 y.o. year old female here with relapsing remitting multiple sclerosis dx'd in 2011. Extensive workup was done in the hospital from October 5-7, 2011.  MRI brain showed multiple intracranial lesions, multiple enhancing lesions, including asymptomatic and symptomatic enhancing lesions.  MRI of the cervical and thoracic spine showed no intrinsic spinal cord lesions. CSF analysis demonstrated greater than 5 oligoclonal bands, with slight elevation in protein.  Extensive testing ruled out other mimicking conditions (HIV, TSH, ANA, hypercoag panel).  Repeat MRI brain on 02/13/10, showed decrease in lesion size and enhancement. Initially on rebif, now on gileyna since 11/17/10. Then MS exacerbation in Nov 2015, in setting of inconsistent gilenya use in 2015. Then off gilenya in June 2016 due to insurance issues. Now back on gilenya since Aug  2016.  MS --> stable Anxiety --> stable Urinary incont --> stable   Dx: MS (multiple sclerosis) ([place]) - Plan: CBC with Differential/Platelet, VITAMIN D 25 Hydroxy (Vit-D Deficiency, Fractures), Comprehensive metabolic panel    PLAN: 1. Continue gilenya  2. Check labs   Orders Placed This Encounter  Procedures  . CBC with Differential/Platelet  . VITAMIN D 25 Hydroxy (Vit-D Deficiency, Fractures)  . Comprehensive metabolic panel   Return in about 6 months (around 10/02/2015).    Penni Bombard, MD 8/91/6945, 03:88 AM Certified in Neurology, Neurophysiology and Neuroimaging  [facility] [address] [place],  [address] [phone]

## 2015-04-05 LAB — COMPREHENSIVE METABOLIC PANEL
ALT: 27 IU/L (ref 0–32)
AST: 25 IU/L (ref 0–40)
Albumin/Globulin Ratio: 2.6 — ABNORMAL HIGH (ref 1.1–2.5)
Albumin: 4.6 g/dL (ref 3.5–5.5)
Alkaline Phosphatase: 58 IU/L (ref 39–117)
BUN/Creatinine Ratio: 14 (ref 8–20)
BUN: 10 mg/dL (ref 6–20)
Bilirubin Total: 0.3 mg/dL (ref 0.0–1.2)
CALCIUM: 10.2 mg/dL (ref 8.7–10.2)
CO2: 27 mmol/L (ref 18–29)
CREATININE: 0.74 mg/dL (ref 0.57–1.00)
Chloride: 102 mmol/L (ref 96–106)
GFR calc Af Amer: 119 mL/min/{1.73_m2} (ref >59)
GFR, EST NON AFRICAN AMERICAN: 103 mL/min/{1.73_m2} (ref >59)
GLOBULIN, TOTAL: 1.8 g/dL (ref 1.5–4.5)
GLUCOSE: 91 mg/dL (ref 65–99)
Potassium: 4.9 mmol/L (ref 3.5–5.2)
SODIUM: 143 mmol/L (ref 134–144)
Total Protein: 6.4 g/dL (ref 6.0–8.5)

## 2015-04-05 LAB — CBC WITH DIFFERENTIAL/PLATELET
BASOS: 0 %
Basophils Absolute: 0 10*3/uL (ref 0.0–0.2)
EOS (ABSOLUTE): 0.1 10*3/uL (ref 0.0–0.4)
EOS: 2 %
HEMATOCRIT: 39.9 % (ref 34.0–46.6)
Hemoglobin: 13.3 g/dL (ref 11.1–15.9)
IMMATURE GRANS (ABS): 0 10*3/uL (ref 0.0–0.1)
IMMATURE GRANULOCYTES: 0 %
LYMPHS: 9 %
Lymphocytes Absolute: 0.6 10*3/uL — ABNORMAL LOW (ref 0.7–3.1)
MCH: 30.6 pg (ref 26.6–33.0)
MCHC: 33.3 g/dL (ref 31.5–35.7)
MCV: 92 fL (ref 79–97)
Monocytes Absolute: 0.8 10*3/uL (ref 0.1–0.9)
Monocytes: 13 %
NEUTROS PCT: 76 %
Neutrophils Absolute: 4.7 10*3/uL (ref 1.4–7.0)
PLATELETS: 263 10*3/uL (ref 150–379)
RBC: 4.34 x10E6/uL (ref 3.77–5.28)
RDW: 13.7 % (ref 12.3–15.4)
WBC: 6.2 10*3/uL (ref 3.4–10.8)

## 2015-04-05 LAB — VITAMIN D 25 HYDROXY (VIT D DEFICIENCY, FRACTURES): Vit D, 25-Hydroxy: 35.6 ng/mL (ref 30.0–100.0)

## 2015-04-07 ENCOUNTER — Telehealth: Payer: Self-pay | Admitting: *Deleted

## 2015-04-07 NOTE — Telephone Encounter (Addendum)
Unable to leave message; will attempt to reach later today.   11:33 am   Spoke with patient and informed her that her lab results are unremarkable. She verbalized understanding, appreciation for call.

## 2015-05-05 ENCOUNTER — Other Ambulatory Visit: Payer: Self-pay | Admitting: Diagnostic Neuroimaging

## 2015-05-07 ENCOUNTER — Other Ambulatory Visit: Payer: Self-pay

## 2015-05-07 MED ORDER — FINGOLIMOD HCL 0.5 MG PO CAPS: 1.0000 | ORAL_CAPSULE | Freq: Every day | ORAL | Status: DC

## 2015-09-09 ENCOUNTER — Telehealth: Payer: Self-pay | Admitting: Diagnostic Neuroimaging

## 2015-09-09 ENCOUNTER — Encounter: Payer: Self-pay | Admitting: Diagnostic Neuroimaging

## 2015-09-09 DIAGNOSIS — G35 Multiple sclerosis: Secondary | ICD-10-CM

## 2015-09-09 NOTE — Telephone Encounter (Signed)
Spoke with patient and informed her that Dr Marjory Lies ordered MRI. She will get call from Danielle to schedule after her insurance process is completed. She verbalized understanding, appreciation.

## 2015-09-09 NOTE — Telephone Encounter (Signed)
i will order MRI brain for surveillance. -VRP

## 2015-09-09 NOTE — Telephone Encounter (Signed)
Pt called in due to insurance ending in a month. She wants to know if she needs and mri and if so can she be scheduled soon. If not ignore message. Please call and advise

## 2015-09-17 ENCOUNTER — Ambulatory Visit (INDEPENDENT_AMBULATORY_CARE_PROVIDER_SITE_OTHER): Payer: Medicaid Other | Admitting: Diagnostic Neuroimaging

## 2015-09-17 ENCOUNTER — Encounter: Payer: Self-pay | Admitting: Diagnostic Neuroimaging

## 2015-09-17 VITALS — BP 120/76 | HR 76 | Ht 62.0 in | Wt 153.6 lb

## 2015-09-17 DIAGNOSIS — G35 Multiple sclerosis: Secondary | ICD-10-CM | POA: Diagnosis not present

## 2015-09-17 DIAGNOSIS — F411 Generalized anxiety disorder: Secondary | ICD-10-CM | POA: Diagnosis not present

## 2015-09-17 DIAGNOSIS — N39498 Other specified urinary incontinence: Secondary | ICD-10-CM

## 2015-09-17 NOTE — Patient Instructions (Addendum)
Thank you for coming to see [place] at [facility]. I hope we have been able to provide you high quality care today.  You may receive a patient satisfaction survey over the next few weeks. We would appreciate your feedback and comments so that we may continue to improve ourselves and the health of our patients.  - continue gilenya and multivitamin - check labs today - check MRI brain within next 1-2 weeks   ~~~~~~~~~~~~~~~~~~~~~~~~~~~~~~~~~~~~~~~~~~~~~~~~~~~~~~~~~~~~~~~~~  DR. PENUMALLI'S GUIDE TO HAPPY AND HEALTHY LIVING These are some of my general health and wellness recommendations. Some of them may apply to you better than others. Please use common sense as you try these suggestions and feel free to ask me any questions.   ACTIVITY/FITNESS Mental, social, emotional and physical stimulation are very important for brain and body health. Try learning a new activity (arts, music, language, sports, games).  Keep moving your body to the best of your abilities. You can do this at home, inside or outside, the park, community center, gym or anywhere you like. Consider a physical therapist or personal trainer to get started. Consider the app Sworkit. Fitness trackers such as smart-watches, smart-phones or Fitbits can help as well.   NUTRITION Eat more plants: colorful vegetables, nuts, seeds and berries.  Eat less sugar, salt, preservatives and processed foods.  Avoid toxins such as cigarettes and alcohol.  Drink water when you are thirsty. Warm water with a slice of lemon is an excellent morning drink to start the day.  Consider these websites for more information The Nutrition Source (https://www.henry-hernandez.biz/) [employer]ision Nutrition (WindowBlog.ch)   RELAXATION Consider practicing mindfulness meditation or other relaxation techniques such as deep breathing, prayer, yoga, tai chi, massage. See website mindful.org or the  apps Headspace or Calm to help get started.   SLEEP Try to get at least 7-8+ hours sleep per day. Regular exercise and reduced caffeine will help you sleep better. Practice good sleep hygeine techniques. See website sleep.org for more information.   PLANNING Prepare estate planning, living will, healthcare POA documents. Sometimes this is best planned with the help of an attorney. Theconversationproject.org and agingwithdignity.org are excellent resources.

## 2015-09-17 NOTE — Progress Notes (Signed)
[facility]  PATIENT: Anne Valenzuela DOB: 1975/10/02  REFERRING CLINICIAN:  HISTORY FROM: patient REASON FOR VISIT: follow up   HISTORICAL  CHIEF COMPLAINT:  Chief Complaint  Patient presents with  . Multiple Sclerosis    rm 7, Gilenya,  01/2014 JCV 2.11, 10/2014 MRI brain; "no new problems, everything is good"  . Follow-up    6 month    HISTORY OF PRESENT ILLNESS:   UPDATE 09/17/15: Since last visit, doing well. Now working out at a gym ([employer]) x 2-3 months and feeling benefits. Tolerating gilenya.   UPDATE 04/04/15: Doing well. Tolerating gilenya and multi-vitamin daily. No new issues. Mood, gait, bladder issues stable.   UPDATE 12/16/14: Since last visit, doing well. Back on gilenya since aug 2016. Anxiety, bladder and gait issues are stable. Overall stable, and satisfied with medication mgmt. Does not want to add any new meds to her regimen.  UPDATE 09/11/14: Since last visit, slightly better. Anxiety and urinary issues better with lifesyle changes. Stopped duloxetine (didn't like side effects). Unfortunately, gilenya coverage stopped due to change in medicaid status. Now off meds x 2 weeks. No new neuro symptoms.   UPDATE 06/07/14: Since last visit, no new neuro symptoms. Anxiety still diff, usually with specific situations. Today she feels calm. She stopped sertraline due to diarrhea, then anxiety worsened. Now back on sertraline. Having some urinary incont (sudden urge).   UPDATE 03/04/14: Since last visit, she feels better. Sertraline helping with depression and anxiety, but causing mild diarrhea. Gilenya is stable. No new neuro events.   UPDATE 01/31/14: Since last visit, is back on gilenya consistently. 2-3 weeks ago, had right hand numb/weak, slurred speech, bladder incontinence. Symptoms now almost resolved for 3 days. Didn't call us b/c she had this appt planned for today. Also with more depression, anxiety.   UPDATE 10/23/13: Patient last  seen in clinic during [facility] trial (last visit 10/25/11). Was getting gilenya through patient assistance. Did not have insurance, and therefore did not follow up in our clinic. Over last 1 year, was "stretching" out gilenya taking it every other day. Now out of meds x 1 week. Now has insurance and trying to get re-established. No new neuro symptoms. Overall doing well. Enrolledi nschool ([employer] program).   UPDATE 11/03/10: Doing well.  Tried TPX and indomethacin for HA, but didn't help.  HA have subsided on their own.   Now interested in [employer] trial.  UPDATE 07/31/10: Has been having headaches (right sided, throbbing; no nausea, vomiting, photo or phono), almost daily but usually 1 hour after rebif injections.  tried fioricet with mild relief.  HA are 5/10 in severity.  Denies new numbness, weakness or vision changes.  No infx sxs.  UPDATE 05/15/10: Started on Rebif in end of Dec 2011.  Some injection site reactions.  Doing about the same.  Weakness essentially resolved.  Depression sxs better.  UPDATE 03/03/10: Doing much better.  All prior sxs essentially resolved.  Back to driving.  Vision, strength, bladder fx much better.  We discussed multiple sclerosis treatmets.  UPDATE 01/13/10: 40 year old female with possible MS.  Hosp f/u visit.  No new complaints.  RUE slightly better.  Still with blurred vision, balance diff. Patient's symptoms started in July 2011, with nausea and diagnosis of urinary tract infection. She then had a 20 pound weight loss. The beginning of October 2011, patient developed right arm weakness and clumsiness. She also had fuzzy vision, headache and urge incontinence. She presented  to [facility] and was found to have multiple subcortical white matter lesions on CT scan and confirmed on MRI. She had extensive workup with lab testing and spinal. She was treated with IV solumedrol and then oral prednisone.   REVIEW OF SYSTEMS: Full 14 system review of systems performed  and negative except: aching muscles.   ALLERGIES: No Known Allergies  HOME MEDICATIONS: Outpatient Prescriptions Prior to Visit  Medication Sig Dispense Refill  . Fingolimod HCl (GILENYA) 0.5 MG CAPS Take 1 capsule (0.5 mg total) by mouth daily. 30 capsule 12  . Multiple Vitamin (MULTIVITAMIN) tablet Take 1 tablet by mouth daily.     No facility-administered medications prior to visit.    PAST MEDICAL HISTORY: Past Medical History  Diagnosis Date  . MS (multiple sclerosis) (HCC)     PAST SURGICAL HISTORY: Past Surgical History  Procedure Laterality Date  . No past surgeries      FAMILY HISTORY: Family History  Problem Relation Age of Onset  . Diabetes Mother     SOCIAL HISTORY:  Social History   Social History  . Marital Status: Married    Spouse Name: N/A  . Number of Children: 1  . Years of Education: HS   Occupational History  .  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 Smoking status: Never Smoker   . Smokeless tobacco: Never Used  . Alcohol Use: No  . Drug Use: No  . Sexual Activity: Not on file   Other Topics Concern  . Not on file   Social History Narrative   Patient lives at home with her daughter.   Caffeine Use: tea 3 cups daily     PHYSICAL EXAM  Filed Vitals:   09/17/15 1016  BP: 120/76  Pulse: 76  Height:  (1.575 m)  Weight: 153 lb 9.6 oz (69.673 kg)    Not recorded     Wt Readings from Last 3 Encounters:  09/17/15 153 lb 9.6 oz (69.673 kg)  04/04/15 161 lb 3.2 oz (73.12 kg)  12/16/14 165 lb 3.2 oz (74.934 kg)   Body mass index is 28.09 kg/(m^2).  GENERAL EXAM: Patient is in no distress; well developed, nourished and groomed; neck is supple  CARDIOVASCULAR: Regular rate and rhythm, no murmurs, no carotid bruits  NEUROLOGIC: MENTAL STATUS: awake, alert, language fluent, comprehension intact, naming intact, fund of knowledge appropriate CRANIAL NERVE: pupils equal and  reactive to light, visual fields full to confrontation, extraocular muscles intact, no nystagmus, facial sensation and strength symmetric, hearing intact, palate elevates symmetrically, uvula midline, shoulder shrug symmetric, tongue midline. MOTOR: normal bulk and tone, full strength in the BUE, BLE SENSORY: normal and symmetric to light touch COORDINATION: finger to nose normal REFLEXES: deep tendon reflexes present and symmetric; BUE 2, BLE 2 GAIT/STATION: narrow based gait; ABLE TO TANDEM; ROMBERG NEG     DIAGNOSTIC DATA (LABS, IMAGING, TESTING) - I reviewed patient records, labs, notes, testing and imaging myself where available.  Lab Results  Component Value Date   WBC 6.2 04/04/2015   HGB 14.0 01/31/2014   HCT 39.9 04/04/2015   MCV 92 04/04/2015   PLT 263 04/04/2015      Component Value Date/Time   NA 143 04/04/2015 1040   NA 141 12/17/2009 1302   K 4.9 04/04/2015 1040   CL 102 04/04/2015 1040   CO2 27 04/04/2015 1040   GLUCOSE 91 04/04/2015 1040   GLUCOSE 90 12/17/2009 1302  BUN 10 04/04/2015 1040   BUN 6 12/17/2009 1302   CREATININE 0.74 04/04/2015 1040   CALCIUM 10.2 04/04/2015 1040   PROT 6.4 04/04/2015 1040   PROT 7.2 12/17/2009 1302   ALBUMIN 4.6 04/04/2015 1040   ALBUMIN 4.1 12/17/2009 1302   AST 25 04/04/2015 1040   ALT 27 04/04/2015 1040   ALKPHOS 58 04/04/2015 1040   BILITOT 0.3 04/04/2015 1040   BILITOT 0.4 01/31/2014 1604   GFRNONAA 103 04/04/2015 1040   GFRAA 119 04/04/2015 1040   Lab Results  Component Value Date   CHOL  12/18/2009    147        ATP III CLASSIFICATION:  <200     mg/dL   Desirable  191-478  mg/dL   Borderline High  >=295    mg/dL   High          HDL 55 12/18/2009   LDLCALC  12/18/2009    80        Total Cholesterol/HDL:CHD Risk Coronary Heart Disease Risk Table                     Men   Women  1/2 Average Risk   3.4   3.3  Average Risk       5.0   4.4  2 X Average Risk   9.6   7.1  3 X Average Risk  23.4   11.0         Use the calculated Patient Ratio above and the CHD Risk Table to determine the patient's CHD Risk.        ATP III CLASSIFICATION (LDL):  <100     mg/dL   Optimal  621-308  mg/dL   Near or Above                    Optimal  130-159  mg/dL   Borderline  657-846  mg/dL   High  >962     mg/dL   Very High   TRIG 58 95/28/4132   CHOLHDL 2.7 12/18/2009   Lab Results  Component Value Date   HGBA1C  12/18/2009    5.3 (NOTE)                                                                       According to the ADA Clinical Practice Recommendations for 2011, when HbA1c is used as a screening test:   >=6.5%   Diagnostic of Diabetes Mellitus           (if abnormal result  is confirmed)  5.7-6.4%   Increased risk of developing Diabetes Mellitus  References:Diagnosis and Classification of Diabetes Mellitus,Diabetes Care,2011,34(Suppl 1):S62-S69 and Standards of Medical Care in         Diabetes - 2011,Diabetes Care,2011,34  (Suppl 1):S11-S61.   No results found for: VITAMINB12 Lab Results  Component Value Date   TSH 3.463 12/18/2009   VIT D, 25-HYDROXY  Date Value Ref Range Status  04/04/2015 35.6 30.0 - 100.0 ng/mL Final    Comment:    Vitamin D deficiency has been defined by the Institute of Medicine and an Endocrine Society practice guideline as a level of serum 25-OH vitamin D less than 20 ng/mL (1,2).  The Endocrine Society went on to further define vitamin D insufficiency as a level between 21 and 29 ng/mL (2). 1. IOM (Institute of Medicine). 2010. Dietary reference    intakes for calcium and D. [place]: The    [employer]. 2. Holick MF, Binkley [place], Bischoff-Ferrari HA, et al.    Evaluation, treatment, and prevention of vitamin D    deficiency: an Endocrine Society clinical practice    guideline. JCEM. 2011 Jul; 96(7):1911-30.   01/31/2014 39.1 30.0 - 100.0 ng/mL Final    Comment:    Vitamin D deficiency has been defined by the Institute of Medicine and an  Endocrine Society practice guideline as a level of serum 25-OH vitamin D less than 20 ng/mL (1,2). The Endocrine Society went on to further define vitamin D insufficiency as a level between 21 and 29 ng/mL (2). 1. IOM (Institute of Medicine). 2010. Dietary reference    intakes for calcium and D. [place]: The    [employer]. 2. Holick MF, Binkley [place], Bischoff-Ferrari HA, et al.    Evaluation, treatment, and prevention of vitamin D    deficiency: an Endocrine Society clinical practice    guideline. JCEM. 2011 Jul; 96(7):1911-30.   10/23/2013 35.2 30.0 - 100.0 ng/mL Final    Comment:    Vitamin D deficiency has been defined by the Institute of Medicine and an Endocrine Society practice guideline as a level of serum 25-OH vitamin D less than 20 ng/mL (1,2). The Endocrine Society went on to further define vitamin D insufficiency as a level between 21 and 29 ng/mL (2). 1. IOM (Institute of Medicine). 2010. Dietary reference    intakes for calcium and D. [place]: The    [employer]. 2. Holick MF, Binkley [place], Bischoff-Ferrari HA, et al.    Evaluation, treatment, and prevention of vitamin D    deficiency: an Endocrine Society clinical practice    guideline. JCEM. 2011 Jul; 96(7):1911-30.   LYMPHOCYTES ABSOLUTE  Date Value Ref Range Status  04/04/2015 0.6* 0.7 - 3.1 x10E3/uL Final  11/13/2014 0.8 0.7 - 3.1 x10E3/uL Final  01/31/2014 0.5* 0.7 - 3.1 x10E3/uL Final   LYMPHS ABS  Date Value Ref Range Status  12/17/2009 1.9 0.7 - 4.0 K/uL Final    11/05/13 MRI brain (with and without contrast) demonstrating: 1. Multiple supratentorial and infratentorial chronic demyelinating plaques.  2. No abnormal enhancing lesions 3. No significant change from MRI on 05/05/11.  02/22/14 MRI brain (with and without) demonstrating: 1. Multiple periventricular, subcortical, juxtacortical, right pontine and left medullary chronic demyelinating plaques. Some of these are  confluent. Some of these are hypointense on T1 views.  2. There are at least 2 small left fronto-parietal DWI hyperintense lesions (isointense on ADC), without enhancement, may represent subacute demyelinating plaques.  3. No abnormal enhancing lesions. 4. Compared to MRI on 11/05/13, there is a new plaque noted in the left frontal region (series 3 image 84; series 8 image 15) with subacute-chronic features, and correlates with patient's recent exacerbation in Nov 2015. Otherwise no significant change.   02/22/14 MRI cervical spine (with and without) demonstrating: 1. The spinal cord is notable chronic demyelinating plaques at the cervico-medullary junction and C2-3 levels. 2. Disc bulging from C2-3 to C6-7. No spinal stenosis or foraminal narrowing. 3. Compared to MRI 12/18/09, the C2-3 plaque is new. The cervico-medullary plaques have decreased in size.  01/31/14 JCV ab - 2.11 (H) positive  10/17/14 MRI brain (with and without) demonstrating: 1. Multiple  supratentorial and infratentorial chronic demyelinating plaques. 2. No acute plaques. 3. Compared to prior MRI on 02/22/14, there are no significant new findings, and resolution of prior DWI hyperintensities.     ASSESSMENT AND PLAN  40 y.o. year old female here with relapsing remitting multiple sclerosis dx'd in 2011. Extensive workup was done in the hospital from October 5-7, 2011.  MRI brain showed multiple intracranial lesions, multiple enhancing lesions, including asymptomatic and symptomatic enhancing lesions.  MRI of the cervical and thoracic spine showed no intrinsic spinal cord lesions. CSF analysis demonstrated greater than 5 oligoclonal bands, with slight elevation in protein.  Extensive testing ruled out other mimicking conditions (HIV, TSH, ANA, hypercoag panel).  Repeat MRI brain on 02/13/10, showed decrease in lesion size and enhancement. Initially on rebif, now on gileyna since 11/17/10. Then MS exacerbation in Nov 2015, in setting  of inconsistent gilenya use in 2015. Then off gilenya in June 2016 due to insurance issues. Now back on gilenya since Aug 2016.   MS --> stable Anxiety --> stable Urinary incont --> stable   Dx:  MS (multiple sclerosis) (HCC)  Anxiety state  Other urinary incontinence    PLAN: - continue gilenya  - check labs - check MRI brain (ordered on 09/09/15) - scheduled urination / bladder emptying - continue exercise program   Orders Placed This Encounter  Procedures  . CBC with Differential/Platelet  . Comprehensive metabolic panel  . VITAMIN D 25 Hydroxy (Vit-D Deficiency, Fractures)   Return in about 6 months (around 03/19/2016).    Suanne Marker, MD 09/17/2015, 10:34 AM Certified in Neurology, Neurophysiology and Neuroimaging  [facility] [address] [place], [place] [address] [phone]

## 2015-09-18 ENCOUNTER — Telehealth: Payer: Self-pay | Admitting: *Deleted

## 2015-09-18 LAB — COMPREHENSIVE METABOLIC PANEL
ALK PHOS: 63 IU/L (ref 39–117)
ALT: 36 IU/L — AB (ref 0–32)
AST: 37 IU/L (ref 0–40)
Albumin/Globulin Ratio: 2 (ref 1.2–2.2)
Albumin: 4.5 g/dL (ref 3.5–5.5)
BUN/Creatinine Ratio: 15 (ref 9–23)
BUN: 12 mg/dL (ref 6–20)
Bilirubin Total: 0.3 mg/dL (ref 0.0–1.2)
CALCIUM: 10.3 mg/dL — AB (ref 8.7–10.2)
CO2: 27 mmol/L (ref 18–29)
CREATININE: 0.78 mg/dL (ref 0.57–1.00)
Chloride: 104 mmol/L (ref 96–106)
GFR calc Af Amer: 112 mL/min/{1.73_m2} (ref >59)
GFR, EST NON AFRICAN AMERICAN: 97 mL/min/{1.73_m2} (ref >59)
GLUCOSE: 112 mg/dL — AB (ref 65–99)
Globulin, Total: 2.2 g/dL (ref 1.5–4.5)
Potassium: 4.5 mmol/L (ref 3.5–5.2)
SODIUM: 146 mmol/L — AB (ref 134–144)
Total Protein: 6.7 g/dL (ref 6.0–8.5)

## 2015-09-18 LAB — CBC WITH DIFFERENTIAL/PLATELET
BASOS ABS: 0 10*3/uL (ref 0.0–0.2)
Basos: 0 %
EOS (ABSOLUTE): 0.1 10*3/uL (ref 0.0–0.4)
EOS: 2 %
HEMATOCRIT: 40.2 % (ref 34.0–46.6)
Hemoglobin: 13.4 g/dL (ref 11.1–15.9)
IMMATURE GRANULOCYTES: 0 %
Immature Grans (Abs): 0 10*3/uL (ref 0.0–0.1)
LYMPHS ABS: 0.5 10*3/uL — AB (ref 0.7–3.1)
Lymphs: 11 %
MCH: 31.2 pg (ref 26.6–33.0)
MCHC: 33.3 g/dL (ref 31.5–35.7)
MCV: 94 fL (ref 79–97)
MONOS ABS: 0.5 10*3/uL (ref 0.1–0.9)
Monocytes: 9 %
NEUTROS PCT: 78 %
Neutrophils Absolute: 4 10*3/uL (ref 1.4–7.0)
PLATELETS: 295 10*3/uL (ref 150–379)
RBC: 4.3 x10E6/uL (ref 3.77–5.28)
RDW: 13.6 % (ref 12.3–15.4)
WBC: 5.1 10*3/uL (ref 3.4–10.8)

## 2015-09-18 LAB — VITAMIN D 25 HYDROXY (VIT D DEFICIENCY, FRACTURES): VIT D 25 HYDROXY: 56.3 ng/mL (ref 30.0–100.0)

## 2015-09-18 NOTE — Telephone Encounter (Signed)
Spoke with patient and informed her that her MRI is approved with insurance. Advised she call [facility], gave her number. She verbalized understanding, appreciation.

## 2015-09-19 ENCOUNTER — Telehealth: Payer: Self-pay | Admitting: *Deleted

## 2015-09-19 NOTE — Telephone Encounter (Signed)
LVM for patient informing her that her lab results are unremarkable, Vit D level is good. Left name, number for any questions.

## 2015-09-25 ENCOUNTER — Ambulatory Visit
Admission: RE | Admit: 2015-09-25 | Discharge: 2015-09-25 | Disposition: A | Discharge status: Home / Self Care | Payer: Medicaid Other | Source: Ambulatory Visit | Attending: Diagnostic Neuroimaging | Admitting: Diagnostic Neuroimaging

## 2015-09-25 DIAGNOSIS — G35 Multiple sclerosis: Secondary | ICD-10-CM

## 2015-09-25 MED ORDER — GADOBENATE DIMEGLUMINE 529 MG/ML IV SOLN
14.0000 mL | Freq: Once | INTRAVENOUS | Status: AC | PRN
  Administered 2015-09-25: 14 mL via INTRAVENOUS

## 2015-09-29 ENCOUNTER — Telehealth: Payer: Self-pay | Admitting: *Deleted

## 2015-09-29 NOTE — Telephone Encounter (Signed)
Per Dr Epimenio Foot, spoke with patient and informed her that her MRI report showed no acute MS related foci, and her last MRI 10/2014 was similar. She verbalized understanding, appreciation for call.

## 2015-10-03 ENCOUNTER — Ambulatory Visit: Payer: Medicaid Other | Admitting: Diagnostic Neuroimaging

## 2016-03-22 ENCOUNTER — Encounter: Payer: Self-pay | Admitting: Diagnostic Neuroimaging

## 2016-03-22 ENCOUNTER — Ambulatory Visit (INDEPENDENT_AMBULATORY_CARE_PROVIDER_SITE_OTHER): Payer: Self-pay | Admitting: Diagnostic Neuroimaging

## 2016-03-22 VITALS — BP 122/72 | HR 74 | Wt 134.0 lb

## 2016-03-22 DIAGNOSIS — F411 Generalized anxiety disorder: Secondary | ICD-10-CM

## 2016-03-22 DIAGNOSIS — G35 Multiple sclerosis: Secondary | ICD-10-CM

## 2016-03-22 NOTE — Progress Notes (Signed)
[facility]  PATIENT: Anne Valenzuela DOB: 13-Dec-1975  REFERRING CLINICIAN:  HISTORY FROM: patient REASON FOR VISIT: follow up   HISTORICAL  CHIEF COMPLAINT:  Chief Complaint  Patient presents with  . Multiple Sclerosis    rm 7, "feeling well; off Gilenya x 1 month due to being dropped from insurance"  . Follow-up    6 month    HISTORY OF PRESENT ILLNESS:   UPDATE 03/22/16: Since last visit, patient had medicaid cancelled for some reason. Now off gilenya. No new MS symptoms.   UPDATE 09/17/15: Since last visit, doing well. Now working out at a gym ([employer]) x 2-3 months and feeling benefits. Tolerating gilenya.   UPDATE 04/04/15: Doing well. Tolerating gilenya and multi-vitamin daily. No new issues. Mood, gait, bladder issues stable.   UPDATE 12/16/14: Since last visit, doing well. Back on gilenya since Aug 2016. Anxiety, bladder and gait issues are stable. Overall stable, and satisfied with medication mgmt. Does not want to add any new meds to her regimen.  UPDATE 09/11/14: Since last visit, slightly better. Anxiety and urinary issues better with lifesyle changes. Stopped duloxetine (didn't like side effects). Unfortunately, gilenya coverage stopped due to change in medicaid status. Now off meds x 2 weeks. No new neuro symptoms.   UPDATE 06/07/14: Since last visit, no new neuro symptoms. Anxiety still diff, usually with specific situations. Today she feels calm. She stopped sertraline due to diarrhea, then anxiety worsened. Now back on sertraline. Having some urinary incont (sudden urge).   UPDATE 03/04/14: Since last visit, she feels better. Sertraline helping with depression and anxiety, but causing mild diarrhea. Gilenya is stable. No new neuro events.   UPDATE 01/31/14: Since last visit, is back on gilenya consistently. 2-3 weeks ago, had right hand numb/weak, slurred speech, bladder incontinence. Symptoms now almost resolved for 3 days. Didn't call us  b/c she had this appt planned for today. Also with more depression, anxiety.   UPDATE 10/23/13: Patient last seen in clinic during [facility] trial (last visit 10/25/11). Was getting gilenya through patient assistance. Did not have insurance, and therefore did not follow up in our clinic. Over last 1 year, was "stretching" out gilenya taking it every other day. Now out of meds x 1 week. Now has insurance and trying to get re-established. No new neuro symptoms. Overall doing well. Enrolledi nschool ([employer] services program).   UPDATE 11/03/10: Doing well.  Tried TPX and indomethacin for HA, but didn't help.  HA have subsided on their own.   Now interested in Pref[employer] trial.  UPDATE 07/31/10: Has been having headaches (right sided, throbbing; no nausea, vomiting, photo or phono), almost daily but usually 1 hour after rebif injections.  tried fioricet with mild relief.  HA are 5/10 in severity.  Denies new numbness, weakness or vision changes.  No infx sxs.  UPDATE 05/15/10: Started on Rebif in end of Dec 2011.  Some injection site reactions.  Doing about the same.  Weakness essentially resolved.  Depression sxs better.  UPDATE 03/03/10: Doing much better.  All prior sxs essentially resolved.  Back to driving.  Vision, strength, bladder fx much better.  We discussed multiple sclerosis treatmets.  UPDATE 01/13/10: 41 year old female with possible MS.  Hosp f/u visit.  No new complaints.  RUE slightly better.  Still with blurred vision, balance diff. Patient's symptoms started in July 2011, with nausea and diagnosis of urinary tract infection. She then had a 20 pound weight loss. The beginning of  October 2011, patient developed right arm weakness and clumsiness. She also had fuzzy vision, headache and urge incontinence. She presented to [facility] and was found to have multiple subcortical white matter lesions on CT scan and confirmed on MRI. She had extensive workup with lab testing and spinal. She was treated  with IV solumedrol and then oral prednisone.    REVIEW OF SYSTEMS: Full 14 system review of systems performed and negative except: aching muscles.   ALLERGIES: No Known Allergies  HOME MEDICATIONS: Outpatient Medications Prior to Visit  Medication Sig Dispense Refill  . Multiple Vitamin (MULTIVITAMIN) tablet Take 1 tablet by mouth daily.    . Fingolimod HCl (GILENYA) 0.5 MG CAPS Take 1 capsule (0.5 mg total) by mouth daily. (Patient not taking: Reported on 03/22/2016) 30 capsule 12   No facility-administered medications prior to visit.     PAST MEDICAL HISTORY: Past Medical History:  Diagnosis Date  . MS (multiple sclerosis) (HCC)     PAST SURGICAL HISTORY: Past Surgical History:  Procedure Laterality Date  . NO PAST SURGERIES      FAMILY HISTORY: Family History  Problem Relation Age of Onset  . Diabetes Mother     SOCIAL HISTORY:  Social History   Social History  . Marital status: Married    Spouse name: N/A  . Number of children: 1  . Years of education: HS   Occupational History  .  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      03/22/16 works for [employer]   Social History Main Topics  . Smoking status: Never Smoker  . Smokeless tobacco: Never Used  . Alcohol use No  . Drug use: No  . Sexual activity: Not on file   Other Topics Concern  . Not on file   Social History Narrative   Patient lives at home with her daughter.   Caffeine Use: tea 3 cups daily     PHYSICAL EXAM  Vitals:   03/22/16 0820  BP: 122/72  Pulse: 74  Weight: 134 lb (60.8 kg)    Not recorded     Wt Readings from Last 3 Encounters:  03/22/16 134 lb (60.8 kg)  09/17/15 153 lb 9.6 oz (69.7 kg)  04/04/15 161 lb 3.2 oz (73.1 kg)   Body mass index is 24.51 kg/m.  GENERAL EXAM: Patient is in no distress; well developed, nourished and groomed; neck is supple  CARDIOVASCULAR: Regular rate and rhythm, no murmurs, no carotid bruits  NEUROLOGIC: MENTAL STATUS: awake,  alert, language fluent, comprehension intact, naming intact, fund of knowledge appropriate CRANIAL NERVE: pupils equal and reactive to light, visual fields full to confrontation, extraocular muscles intact, no nystagmus, facial sensation and strength symmetric, hearing intact, palate elevates symmetrically, uvula midline, shoulder shrug symmetric, tongue midline. MOTOR: normal bulk and tone, full strength in the BUE, BLE SENSORY: normal and symmetric to light touch COORDINATION: finger to nose normal REFLEXES: deep tendon reflexes present and symmetric; BUE 2, BLE 2 GAIT/STATION: narrow based gait; ABLE TO TANDEM; ROMBERG NEG     DIAGNOSTIC DATA (LABS, IMAGING, TESTING) - I reviewed patient records, labs, notes, testing and imaging myself where available.  Lab Results  Component Value Date   WBC 5.1 09/17/2015   HGB 14.0 01/31/2014   HCT 40.2 09/17/2015   MCV 94 09/17/2015   PLT 295 09/17/2015      Component Value Date/Time   NA 146 (H) 09/17/2015 1059   K 4.5 09/17/2015 1059  CL 104 09/17/2015 1059   CO2 27 09/17/2015 1059   GLUCOSE 112 (H) 09/17/2015 1059   GLUCOSE 90 12/17/2009 1302   BUN 12 09/17/2015 1059   CREATININE 0.78 09/17/2015 1059   CALCIUM 10.3 (H) 09/17/2015 1059   PROT 6.7 09/17/2015 1059   ALBUMIN 4.5 09/17/2015 1059   AST 37 09/17/2015 1059   ALT 36 (H) 09/17/2015 1059   ALKPHOS 63 09/17/2015 1059   BILITOT 0.3 09/17/2015 1059   GFRNONAA 97 09/17/2015 1059   GFRAA 112 09/17/2015 1059   Lab Results  Component Value Date   CHOL  12/18/2009    147        ATP III CLASSIFICATION:  <200     mg/dL   Desirable  161-096  mg/dL   Borderline High  >=045    mg/dL   High          HDL 55 12/18/2009   LDLCALC  12/18/2009    80        Total Cholesterol/HDL:CHD Risk Coronary Heart Disease Risk Table                     Men   Women  1/2 Average Risk   3.4   3.3  Average Risk       5.0   4.4  2 X Average Risk   9.6   7.1  3 X Average Risk  23.4   11.0         Use the calculated Patient Ratio above and the CHD Risk Table to determine the patient's CHD Risk.        ATP III CLASSIFICATION (LDL):  <100     mg/dL   Optimal  409-811  mg/dL   Near or Above                    Optimal  130-159  mg/dL   Borderline  914-782  mg/dL   High  >956     mg/dL   Very High   TRIG 58 21/30/8657   CHOLHDL 2.7 12/18/2009   Lab Results  Component Value Date   HGBA1C  12/18/2009    5.3 (NOTE)                                                                       According to the ADA Clinical Practice Recommendations for 2011, when HbA1c is used as a screening test:   >=6.5%   Diagnostic of Diabetes Mellitus           (if abnormal result  is confirmed)  5.7-6.4%   Increased risk of developing Diabetes Mellitus  References:Diagnosis and Classification of Diabetes Mellitus,Diabetes Care,2011,34(Suppl 1):S62-S69 and Standards of Medical Care in         Diabetes - 2011,Diabetes Care,2011,34  (Suppl 1):S11-S61.   No results found for: VITAMINB12 Lab Results  Component Value Date   TSH 3.463 12/18/2009   Vit D, 25-Hydroxy  Date Value Ref Range Status  09/17/2015 56.3 30.0 - 100.0 ng/mL Final    Comment:    Vitamin D deficiency has been defined by the Institute of Medicine and an Endocrine Society practice guideline as a level of serum 25-OH vitamin D less than  20 ng/mL (1,2). The Endocrine Society went on to further define vitamin D insufficiency as a level between 21 and 29 ng/mL (2). 1. IOM (Institute of Medicine). 2010. Dietary reference    intakes for calcium and D. [place]: The    [employer]. 2. Holick MF, Binkley [place], Bischoff-Ferrari HA, et al.    Evaluation, treatment, and prevention of vitamin D    deficiency: an Endocrine Society clinical practice    guideline. JCEM. 2011 Jul; 96(7):1911-30.   04/04/2015 35.6 30.0 - 100.0 ng/mL Final    Comment:    Vitamin D deficiency has been defined by the Institute of Medicine and an  Endocrine Society practice guideline as a level of serum 25-OH vitamin D less than 20 ng/mL (1,2). The Endocrine Society went on to further define vitamin D insufficiency as a level between 21 and 29 ng/mL (2). 1. IOM (Institute of Medicine). 2010. Dietary reference    intakes for calcium and D. [place]: The    [employer]. 2. Holick MF, Binkley [place], Bischoff-Ferrari HA, et al.    Evaluation, treatment, and prevention of vitamin D    deficiency: an Endocrine Society clinical practice    guideline. JCEM. 2011 Jul; 96(7):1911-30.   01/31/2014 39.1 30.0 - 100.0 ng/mL Final    Comment:    Vitamin D deficiency has been defined by the Institute of Medicine and an Endocrine Society practice guideline as a level of serum 25-OH vitamin D less than 20 ng/mL (1,2). The Endocrine Society went on to further define vitamin D insufficiency as a level between 21 and 29 ng/mL (2). 1. IOM (Institute of Medicine). 2010. Dietary reference    intakes for calcium and D. [place]: The    [employer]. 2. Holick MF, Binkley [place], Bischoff-Ferrari HA, et al.    Evaluation, treatment, and prevention of vitamin D    deficiency: an Endocrine Society clinical practice    guideline. JCEM. 2011 Jul; 96(7):1911-30.    Lymphocytes Absolute  Date Value Ref Range Status  09/17/2015 0.5 (L) 0.7 - 3.1 x10E3/uL Final  04/04/2015 0.6 (L) 0.7 - 3.1 x10E3/uL Final  11/13/2014 0.8 0.7 - 3.1 x10E3/uL Final    11/05/13 MRI brain (with and without contrast) demonstrating: 1. Multiple supratentorial and infratentorial chronic demyelinating plaques.  2. No abnormal enhancing lesions 3. No significant change from MRI on 05/05/11.  02/22/14 MRI brain (with and without) demonstrating: 1. Multiple periventricular, subcortical, juxtacortical, right pontine and left medullary chronic demyelinating plaques. Some of these are confluent. Some of these are hypointense on T1 views.  2. There are at  least 2 small left fronto-parietal DWI hyperintense lesions (isointense on ADC), without enhancement, may represent subacute demyelinating plaques.  3. No abnormal enhancing lesions. 4. Compared to MRI on 11/05/13, there is a new plaque noted in the left frontal region (series 3 image 84; series 8 image 15) with subacute-chronic features, and correlates with patient's recent exacerbation in Nov 2015. Otherwise no significant change.   02/22/14 MRI cervical spine (with and without) demonstrating: 1. The spinal cord is notable chronic demyelinating plaques at the cervico-medullary junction and C2-3 levels. 2. Disc bulging from C2-3 to C6-7. No spinal stenosis or foraminal narrowing. 3. Compared to MRI 12/18/09, the C2-3 plaque is new. The cervico-medullary plaques have decreased in size.  01/31/14 JCV ab - 2.11 (H) positive  10/17/14 MRI brain (with and without) demonstrating: 1. Multiple supratentorial and infratentorial chronic demyelinating plaques. 2. No acute plaques. 3. Compared  to prior MRI on 02/22/14, there are no significant new findings, and resolution of prior DWI hyperintensities.   09/25/15 MRI brain (with and without contrast) 1.   There are multiple T2/FLAIR hyperintense foci in the brainstem and hemispheres in a pattern and configuration consistent with chronic demyelinating plaque associated with multiple sclerosis. None of the foci appears to be acute. 2.   Compared to the MRI dated 10/17/2014, there is no interval change. 3.   There are no acute findings.    ASSESSMENT AND PLAN  41 y.o. year old female here with relapsing remitting multiple sclerosis dx'd in 2011. Extensive workup was done in the hospital from October 5-7, 2011.  MRI brain showed multiple intracranial lesions, multiple enhancing lesions, including asymptomatic and symptomatic enhancing lesions.  MRI of the cervical and thoracic spine showed no intrinsic spinal cord lesions. CSF analysis demonstrated greater  than 5 oligoclonal bands, with slight elevation in protein.  Extensive testing ruled out other mimicking conditions (HIV, TSH, ANA, hypercoag panel).  Repeat MRI brain on 02/13/10, showed decrease in lesion size and enhancement. Initially on rebif, now on gileyna since 11/17/10. Then MS exacerbation in Nov 2015, in setting of inconsistent gilenya use in 2015. Then off gilenya in June 2016 due to insurance issues. Now back on gilenya since Aug 2016; but off again in Dec 2017 due to loss of insurance.    MS --> stable Anxiety --> stable Urinary incont --> stable   Dx:  MS (multiple sclerosis) (HCC) - Plan: Ambulatory referral to Social Work  Anxiety state - Plan: Ambulatory referral to Social Work    PLAN: - will setup social work consult (patient was on medicaid, and apparently this was cancelled; pt needs help to get this re-instated; patient is disabled from MS) - continue exercise program   Orders Placed This Encounter  Procedures  . Ambulatory referral to Social Work   Return in about 1 month (around 04/22/2016).    Suanne Marker, MD 03/22/2016, 8:51 AM Certified in Neurology, Neurophysiology and Neuroimaging  [facility] [address], [address] [place], [place] [address] [phone]

## 2016-03-24 ENCOUNTER — Telehealth: Payer: Self-pay | Admitting: Diagnostic Neuroimaging

## 2016-03-24 NOTE — Telephone Encounter (Signed)
Patient no longer has insurance. Medicaid has denied patient at this time. Patient is aware. Patient is working now. I have sent patient a form where she can go to [employer] Attention:  Customer Service.  [address] [place], [place] [address].   To apply she needs to go in person with form with proof of her income Or patient can mail form with proof of income .  I have explained to patient she will have to get this set up on her own to get in the program. If she is approved she will be able to see physicians within [facility].  I have spoken to patient about details. She was happy and I have mailed her the form.

## 2016-04-27 ENCOUNTER — Ambulatory Visit: Payer: Self-pay | Admitting: Diagnostic Neuroimaging

## 2016-06-25 ENCOUNTER — Ambulatory Visit: Payer: Self-pay | Admitting: Diagnostic Neuroimaging

## 2016-07-26 IMAGING — MR MR HEAD WO/W CM
10 of 11 series · 40 of 48 positions shown · non-contrast
Comparison: none

[Series 3: T1 · sagittal · 5.0mm · 0.45mm/px · 3 of 19 slices shown]
[im 1/19]
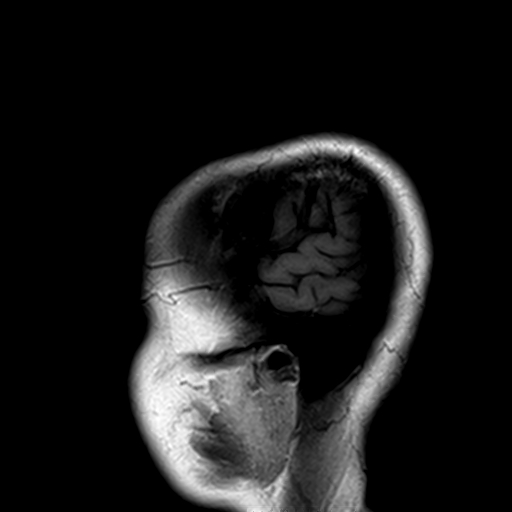
[im 10/19]
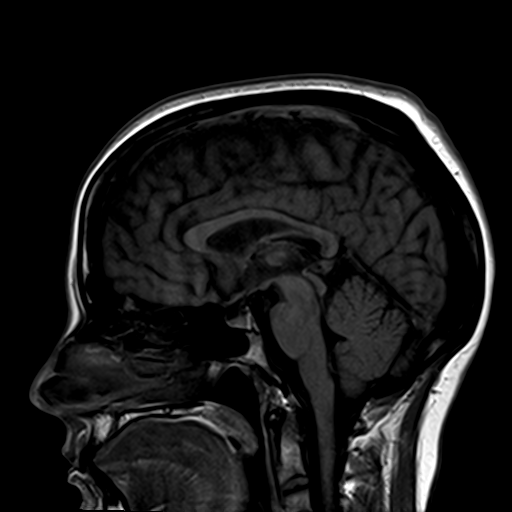
[im 19/19]
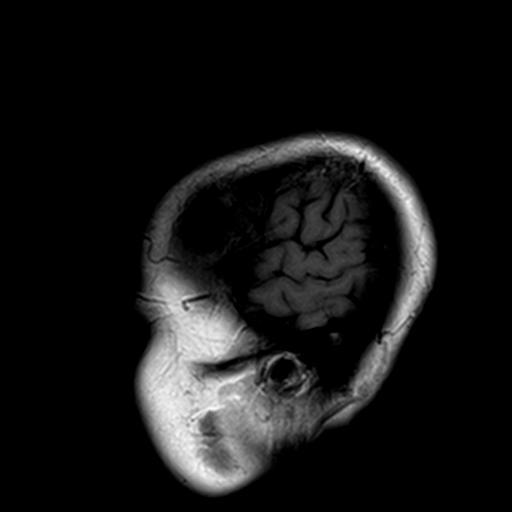

[Series 4: FLAIR · sagittal · 5.0mm · 0.45mm/px · 3 of 19 slices shown (1 of 2)]
[im 1/19]
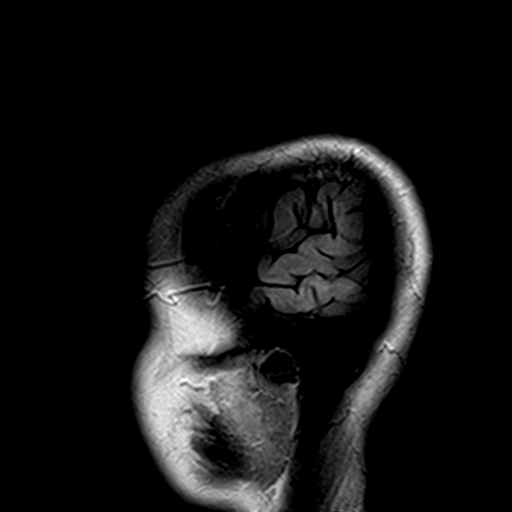
[im 10/19]
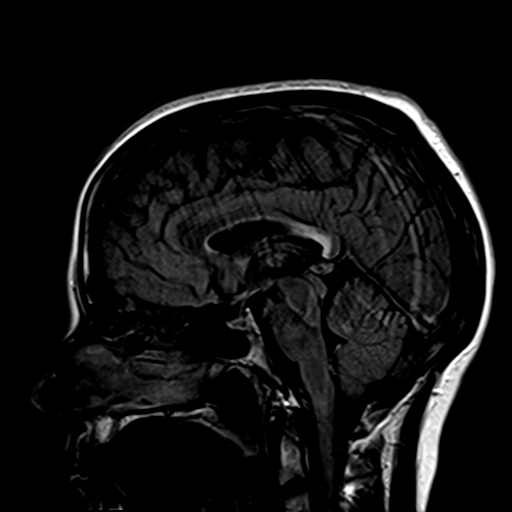
[im 19/19]
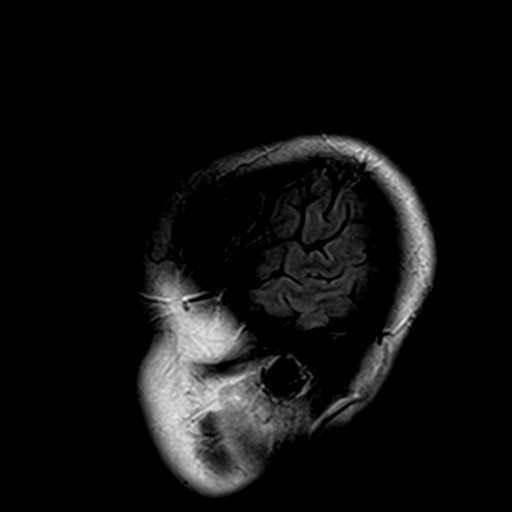

[Series 5: DWI · axial · 3.0mm · 1.80mm/px · z∈[-71,+63]mm · 9 of 92 slices shown (1 of 2)]
[im 1/92]
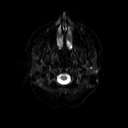
[im 12/92]
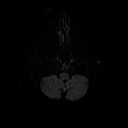
[im 23/92]
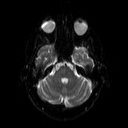
[im 35/92]
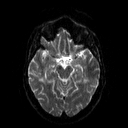
[im 46/92]
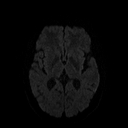
[im 57/92]
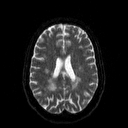
[im 69/92]
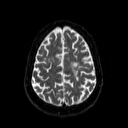
[im 80/92]
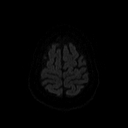
[im 92/92]
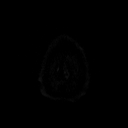

[Series 6: DWI · axial · 3.0mm · 1.80mm/px · z∈[-71,+63]mm · 4 of 44 slices shown (2 of 2)]
[im 1/44]
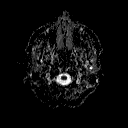
[im 15/44]
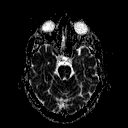
[im 29/44]
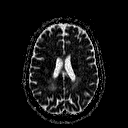
[im 44/44]
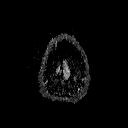

[Series 7: T2 · axial · 5.0mm · 0.51mm/px · z∈[-70,+65]mm · 2 of 22 slices shown (1 of 2)]
[im 1/22]
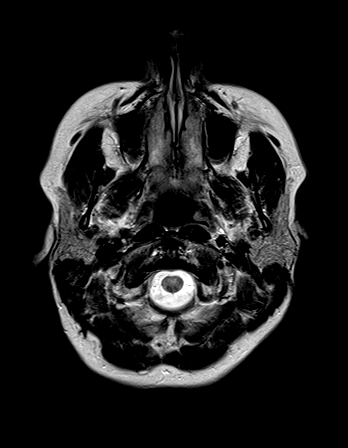
[im 22/22]
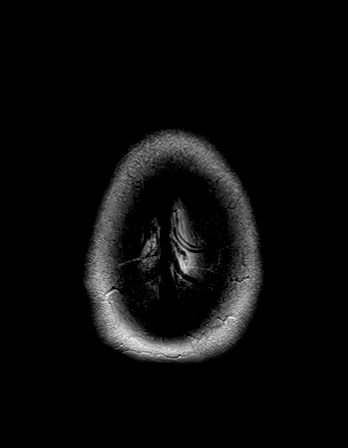

[Series 8: FLAIR · axial · 5.0mm · 0.45mm/px · z∈[-70,+66]mm · 2 of 22 slices shown (2 of 2)]
[im 1/22]
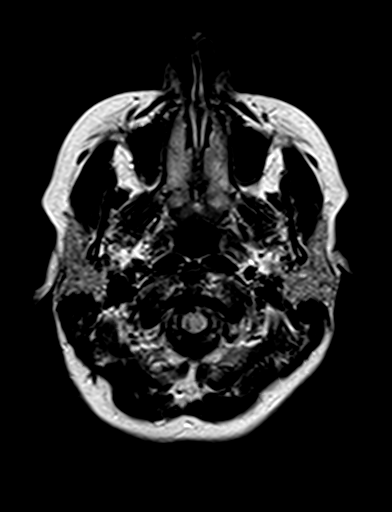
[im 22/22]
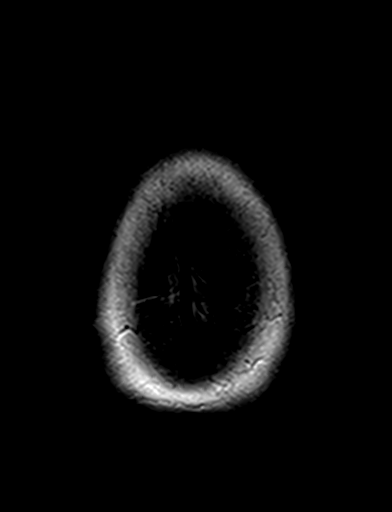

[Series 10: swi_images · axial · 2.0mm · 0.90mm/px · z∈[-73,+67]mm · 7 of 72 slices shown]
[im 1/72]
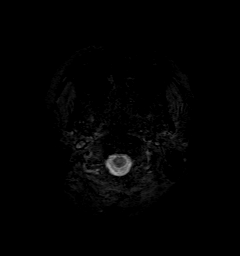
[im 12/72]
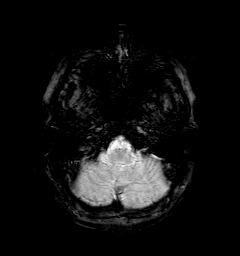
[im 24/72]
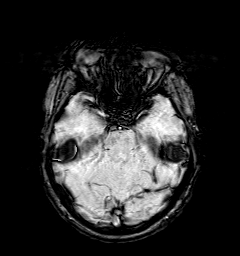
[im 36/72]
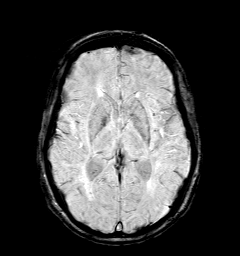
[im 48/72]
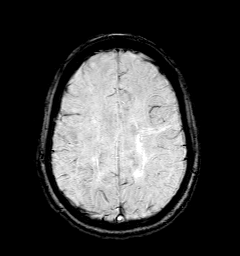
[im 60/72]
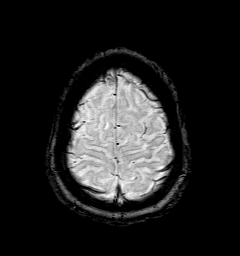
[im 72/72]
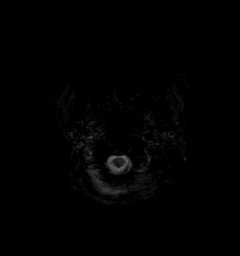

[Series 11: t1_mpr_tra · axial · 2.0mm · 0.45mm/px · z∈[-70,+70]mm · 7 of 72 slices shown (1 of 2)]
[im 1/72]
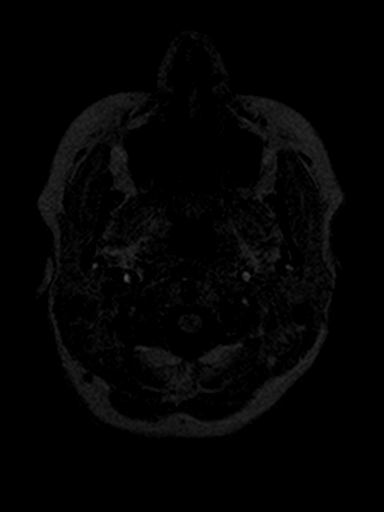
[im 12/72]
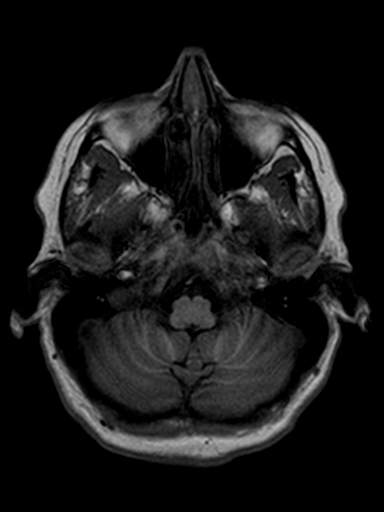
[im 24/72]
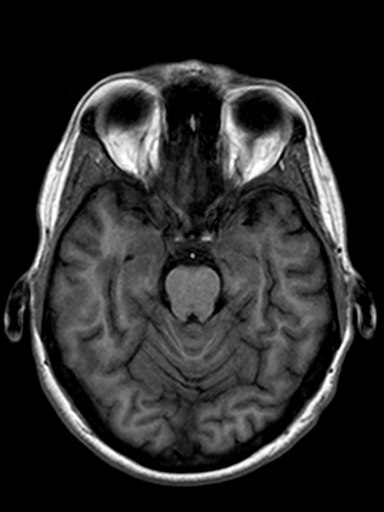
[im 36/72]
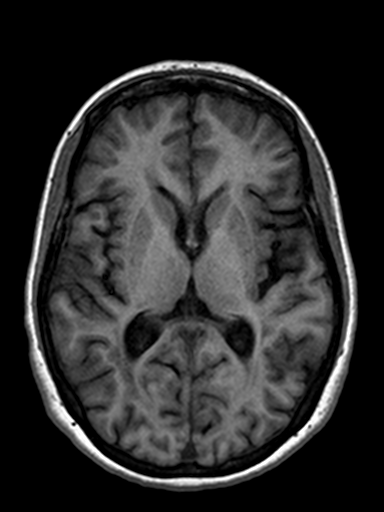
[im 48/72]
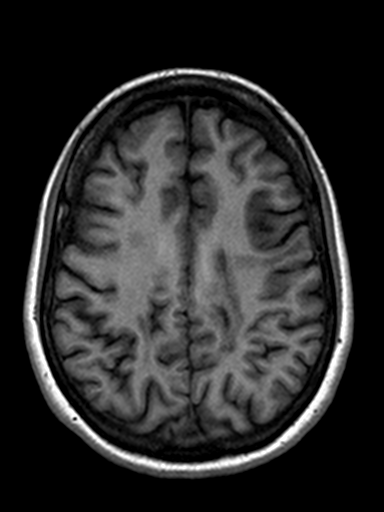
[im 60/72]
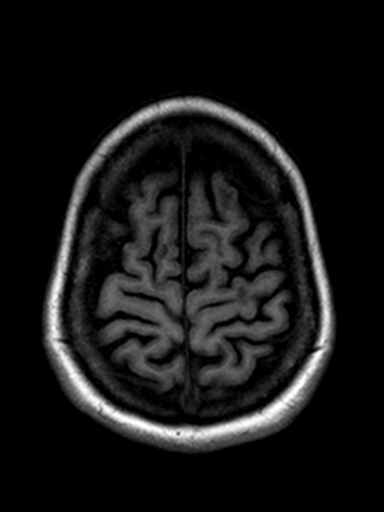
[im 72/72]
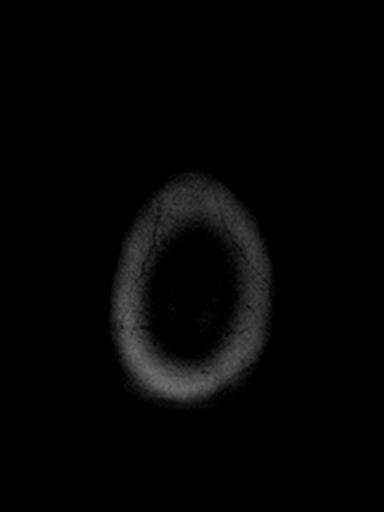

[Series 12: T2 · coronal · 5.0mm · 0.45mm/px · 2 of 25 slices shown (2 of 2)]
[im 1/25]
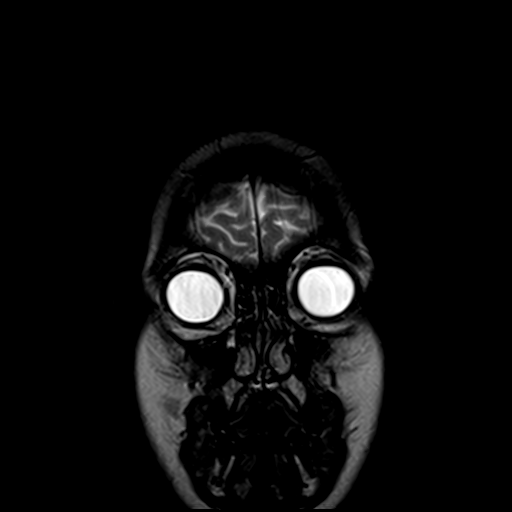
[im 25/25]
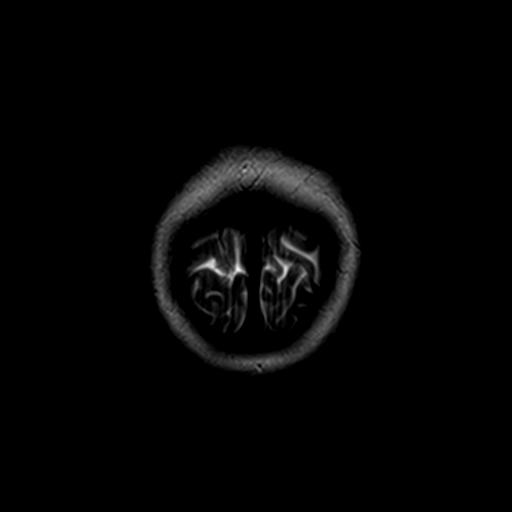

[Series 13: t1_mpr_tra · axial · 2.0mm · 0.45mm/px · 1 of 72 slices shown (2 of 2)]
[im 1/72]
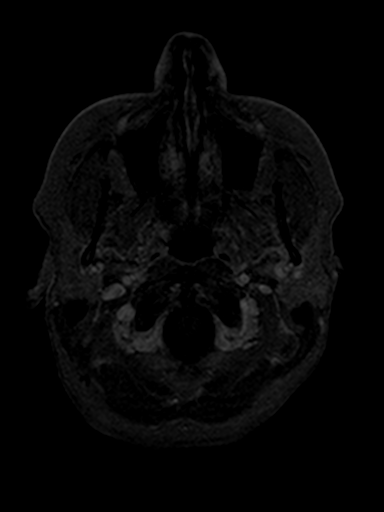

[40 of 48 positions shown; findings below may reference images not displayed]

Canned report from images found in remote index.

Refer to host system for actual result text.

## 2016-10-08 ENCOUNTER — Ambulatory Visit: Payer: Self-pay | Admitting: Diagnostic Neuroimaging

## 2017-12-06 ENCOUNTER — Encounter: Payer: Self-pay | Admitting: Diagnostic Neuroimaging

## 2017-12-06 ENCOUNTER — Telehealth: Payer: Self-pay | Admitting: Diagnostic Neuroimaging

## 2017-12-06 ENCOUNTER — Ambulatory Visit (INDEPENDENT_AMBULATORY_CARE_PROVIDER_SITE_OTHER): Payer: BLUE CROSS/BLUE SHIELD | Admitting: Diagnostic Neuroimaging

## 2017-12-06 VITALS — BP 105/59 | HR 53 | Ht 62.0 in | Wt 121.4 lb

## 2017-12-06 DIAGNOSIS — G35 Multiple sclerosis: Secondary | ICD-10-CM | POA: Diagnosis not present

## 2017-12-06 NOTE — Telephone Encounter (Signed)
[employer] Auth: NPR Ref # [place] [facility] has [facility] they will reach out to the pt to schedule.

## 2017-12-06 NOTE — Telephone Encounter (Signed)
I spoke to Patient and she is ok and does not need any assistance at this time she is working and she has Aeronautical engineer .

## 2017-12-06 NOTE — Telephone Encounter (Signed)
Called patient and left her asking her to call me back about patient assistance [facility] denied her. I would Like to see if there is anything else I can do to help patient.

## 2017-12-06 NOTE — Progress Notes (Signed)
[facility]  PATIENT: Anne Valenzuela DOB: Aug 18, 1975  REFERRING CLINICIAN:  HISTORY FROM: patient REASON FOR VISIT: follow up   HISTORICAL  CHIEF COMPLAINT:  Chief Complaint  Patient presents with  . Follow-up    Rm 6, alone  . Multiple Sclerosis    Has not been taking gilenya she states for 2 yrs. (SW referral ([facility] assistance) was not helpful).  She is not interesed in therapy.    HISTORY OF PRESENT ILLNESS:   UPDATE (12/06/17, VRP): Since last visit, doing WELL. Symptoms are stable. Severity is mild. No alleviating or aggravating factors. Has been off gilenya. Now has insurance again. Doing well. No new numbness, weakness, slurred speech or vision changes. Fatigue continues. Working at [employer]. Using CBD oil for general muscle pain.    UPDATE 03/22/16: Since last visit, patient had medicaid cancelled for some reason. Now off gilenya. No new MS symptoms.   UPDATE 09/17/15: Since last visit, doing well. Now working out at a gym ([employer]) x 2-3 months and feeling benefits. Tolerating gilenya.   UPDATE 04/04/15: Doing well. Tolerating gilenya and multi-vitamin daily. No new issues. Mood, gait, bladder issues stable.   UPDATE 12/16/14: Since last visit, doing well. Back on gilenya since Aug 2016. Anxiety, bladder and gait issues are stable. Overall stable, and satisfied with medication mgmt. Does not want to add any new meds to her regimen.  UPDATE 09/11/14: Since last visit, slightly better. Anxiety and urinary issues better with lifesyle changes. Stopped duloxetine (didn't like side effects). Unfortunately, gilenya coverage stopped due to change in medicaid status. Now off meds x 2 weeks. No new neuro symptoms.   UPDATE 06/07/14: Since last visit, no new neuro symptoms. Anxiety still diff, usually with specific situations. Today she feels calm. She stopped sertraline due to diarrhea, then anxiety worsened. Now back on sertraline. Having some urinary incont  (sudden urge).   UPDATE 03/04/14: Since last visit, she feels better. Sertraline helping with depression and anxiety, but causing mild diarrhea. Gilenya is stable. No new neuro events.   UPDATE 01/31/14: Since last visit, is back on gilenya consistently. 2-3 weeks ago, had right hand numb/weak, slurred speech, bladder incontinence. Symptoms now almost resolved for 3 days. Didn't call us b/c she had this appt planned for today. Also with more depression, anxiety.   UPDATE 10/23/13: Patient last seen in clinic during [facility] trial (last visit 10/25/11). Was getting gilenya through patient assistance. Did not have insurance, and therefore did not follow up in our clinic. Over last 1 year, was "stretching" out gilenya taking it every other day. Now out of meds x 1 week. Now has insurance and trying to get re-established. No new neuro symptoms. Overall doing well. Enrolledi nschool (funeral services program).   UPDATE 11/03/10: Doing well.  Tried TPX and indomethacin for HA, but didn't help.  HA have subsided on their own.   Now interested in PreferMS trial.  UPDATE 07/31/10: Has been having headaches (right sided, throbbing; no nausea, vomiting, photo or phono), almost daily but usually 1 hour after rebif injections.  tried fioricet with mild relief.  HA are 5/10 in severity.  Denies new numbness, weakness or vision changes.  No infx sxs.  UPDATE 05/15/10: Started on Rebif in end of Dec 2011.  Some injection site reactions.  Doing about the same.  Weakness essentially resolved.  Depression sxs better.  UPDATE 03/03/10: Doing much better.  All prior sxs essentially resolved.  Back to driving.  Vision, strength, bladder  fx much better.  We discussed multiple sclerosis treatmets.  UPDATE 01/13/10: 42 year old female with possible MS.  Hosp f/u visit.  No new complaints.  RUE slightly better.  Still with blurred vision, balance diff. Patient's symptoms started in July 2011, with nausea and diagnosis of urinary  tract infection. She then had a 20 pound weight loss. The beginning of October 2011, patient developed right arm weakness and clumsiness. She also had fuzzy vision, headache and urge incontinence. She presented to [facility] and was found to have multiple subcortical white matter lesions on CT scan and confirmed on MRI. She had extensive workup with lab testing and spinal. She was treated with IV solumedrol and then oral prednisone.    REVIEW OF SYSTEMS: Full 14 system review of systems performed and negative except: freq urination incontinence.    ALLERGIES: No Known Allergies  HOME MEDICATIONS: Outpatient Medications Prior to Visit  Medication Sig Dispense Refill  . OVER THE COUNTER MEDICATION CBD OIL sublingual drops daily.    . Fingolimod HCl (GILENYA) 0.5 MG CAPS Take 1 capsule (0.5 mg total) by mouth daily. (Patient not taking: Reported on 03/22/2016) 30 capsule 12  . Multiple Vitamin (MULTIVITAMIN) tablet Take 1 tablet by mouth daily.     No facility-administered medications prior to visit.     PAST MEDICAL HISTORY: Past Medical History:  Diagnosis Date  . MS (multiple sclerosis) (HCC)     PAST SURGICAL HISTORY: Past Surgical History:  Procedure Laterality Date  . NO PAST SURGERIES      FAMILY HISTORY: Family History  Problem Relation Age of Onset  . Diabetes Mother     SOCIAL HISTORY:  Social History   Socioeconomic History  . Marital status: Married    Spouse name: Not on file  . Number of children: 1  . Years of education: HS  . 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 Financial resource strain: Not on file  . Food insecurity:    Worry: Not on file    Inability: Not on file  . Transportation needs:    Medical: Not on file    Non-medical: Not on file  Tobacco Use  . Smoking status: Never Smoker  . Smokeless  tobacco: Never Used  Substance and Sexual Activity  . Alcohol use: No    Alcohol/week: 0.0 standard drinks  . Drug use: No  . Sexual activity: Not on file  Lifestyle  . Physical activity:    Days per week: Not on file    Minutes per session: Not on file  . Stress: Not on file  Relationships  . Social connections:    Talks on phone: Not on file    Gets together: Not on file    Attends religious service: Not on file    Active member of club or organization: Not on file    Attends meetings of clubs or organizations: Not on file    Relationship status: Not on file  . Intimate partner violence:    Fear of current or ex partner: Not on file    Emotionally abused: Not on file    Physically abused: Not on file    Forced sexual activity: Not on file  Other Topics Concern  . Not on file  Social History Narrative   Patient lives at home with her  daughter.   Caffeine Use: tea 3 cups daily     PHYSICAL EXAM  GENERAL EXAM/CONSTITUTIONAL: Vitals:  Vitals:   12/06/17 0843  BP: (!) 105/59  Pulse: (!) 53  Weight: 121 lb 6.4 oz (55.1 kg)  Height: 5\' 2"  (1.575 m)     Body mass index is 22.2 kg/m. Wt Readings from Last 3 Encounters:  12/06/17 121 lb 6.4 oz (55.1 kg)  03/22/16 134 lb (60.8 kg)  09/17/15 153 lb 9.6 oz (69.7 kg)     Patient is in no distress; well developed, nourished and groomed; neck is supple  CARDIOVASCULAR:  Examination of carotid arteries is normal; no carotid bruits  Regular rate and rhythm, no murmurs  Examination of peripheral vascular system by observation and palpation is normal  EYES:  Ophthalmoscopic exam of optic discs and posterior segments is normal; no papilledema or hemorrhages  Visual Acuity Screening   Right eye Left eye Both eyes  Without correction:     With correction: 20/30 20/30      MUSCULOSKELETAL:  Gait, strength, tone, movements noted in Neurologic exam below  NEUROLOGIC: MENTAL STATUS:  No flowsheet data  found.  awake, alert, oriented to person, place and time  recent and remote memory intact  normal attention and concentration  language fluent, comprehension intact, naming intact  fund of knowledge appropriate  CRANIAL NERVE:   2nd - no papilledema on fundoscopic exam  2nd, 3rd, 4th, 6th - pupils equal and reactive to light, visual fields full to confrontation, extraocular muscles intact, no nystagmus  5th - facial sensation symmetric  7th - facial strength symmetric  8th - hearing intact  9th - palate elevates symmetrically, uvula midline  11th - shoulder shrug symmetric  12th - tongue protrusion midline  MOTOR:   normal bulk and tone, full strength in the BUE, BLE  SENSORY:   normal and symmetric to light touch, temperature  COORDINATION:   finger-nose-finger, fine finger movements normal  REFLEXES:   deep tendon reflexes present and symmetric  GAIT/STATION:   narrow based gait; ABLE TO WALK TANDEM      DIAGNOSTIC DATA (LABS, IMAGING, TESTING) - I reviewed patient records, labs, notes, testing and imaging myself where available.  Lab Results  Component Value Date   WBC 5.1 09/17/2015   HGB 13.4 09/17/2015   HCT 40.2 09/17/2015   MCV 94 09/17/2015   PLT 295 09/17/2015      Component Value Date/Time   NA 146 (H) 09/17/2015 1059   K 4.5 09/17/2015 1059   CL 104 09/17/2015 1059   CO2 27 09/17/2015 1059   GLUCOSE 112 (H) 09/17/2015 1059   GLUCOSE 90 12/17/2009 1302   BUN 12 09/17/2015 1059   CREATININE 0.78 09/17/2015 1059   CALCIUM 10.3 (H) 09/17/2015 1059   PROT 6.7 09/17/2015 1059   ALBUMIN 4.5 09/17/2015 1059   AST 37 09/17/2015 1059   ALT 36 (H) 09/17/2015 1059   ALKPHOS 63 09/17/2015 1059   BILITOT 0.3 09/17/2015 1059   GFRNONAA 97 09/17/2015 1059   GFRAA 112 09/17/2015 1059   Lab Results  Component Value Date   CHOL  12/18/2009    147        ATP III CLASSIFICATION:  <200     mg/dL   Desirable  782-956  mg/dL   Borderline  High  >=213    mg/dL   High          HDL 55 12/18/2009   LDLCALC  12/18/2009  80        Total Cholesterol/HDL:CHD Risk Coronary Heart Disease Risk Table                     Men   Women  1/2 Average Risk   3.4   3.3  Average Risk       5.0   4.4  2 X Average Risk   9.6   7.1  3 X Average Risk  23.4   11.0        Use the calculated Patient Ratio above and the CHD Risk Table to determine the patient's CHD Risk.        ATP III CLASSIFICATION (LDL):  <100     mg/dL   Optimal  086-578  mg/dL   Near or Above                    Optimal  130-159  mg/dL   Borderline  469-629  mg/dL   High  >528     mg/dL   Very High   TRIG 58 41/32/4401   CHOLHDL 2.7 12/18/2009   Lab Results  Component Value Date   HGBA1C  12/18/2009    5.3 (NOTE)                                                                       According to the ADA Clinical Practice Recommendations for 2011, when HbA1c is used as a screening test:   >=6.5%   Diagnostic of Diabetes Mellitus           (if abnormal result  is confirmed)  5.7-6.4%   Increased risk of developing Diabetes Mellitus  References:Diagnosis and Classification of Diabetes Mellitus,Diabetes Care,2011,34(Suppl 1):S62-S69 and Standards of Medical Care in         Diabetes - 2011,Diabetes Care,2011,34  (Suppl 1):S11-S61.   No results found for: VITAMINB12 Lab Results  Component Value Date   TSH 3.463 12/18/2009   Vit D, 25-Hydroxy  Date Value Ref Range Status  09/17/2015 56.3 30.0 - 100.0 ng/mL Final    Comment:    Vitamin D deficiency has been defined by the Institute of Medicine and an Endocrine Society practice guideline as a level of serum 25-OH vitamin D less than 20 ng/mL (1,2). The Endocrine Society went on to further define vitamin D insufficiency as a level between 21 and 29 ng/mL (2). 1. IOM (Institute of Medicine). 2010. Dietary reference    intakes for calcium and D. [place]: The    [employer]. 2. Holick MF, Binkley  [place], Bischoff-Ferrari HA, et al.    Evaluation, treatment, and prevention of vitamin D    deficiency: an Endocrine Society clinical practice    guideline. JCEM. 2011 Jul; 96(7):1911-30.   04/04/2015 35.6 30.0 - 100.0 ng/mL Final    Comment:    Vitamin D deficiency has been defined by the Institute of Medicine and an Endocrine Society practice guideline as a level of serum 25-OH vitamin D less than 20 ng/mL (1,2). The Endocrine Society went on to further define vitamin D insufficiency as a level between 21 and 29 ng/mL (2). 1. IOM (Institute of Medicine). 2010. Dietary reference    intakes for calcium and  D. [place]: The    [employer]. 2. Holick MF, Binkley [place], Bischoff-Ferrari HA, et al.    Evaluation, treatment, and prevention of vitamin D    deficiency: an Endocrine Society clinical practice    guideline. JCEM. 2011 Jul; 96(7):1911-30.   01/31/2014 39.1 30.0 - 100.0 ng/mL Final    Comment:    Vitamin D deficiency has been defined by the Institute of Medicine and an Endocrine Society practice guideline as a level of serum 25-OH vitamin D less than 20 ng/mL (1,2). The Endocrine Society went on to further define vitamin D insufficiency as a level between 21 and 29 ng/mL (2). 1. IOM (Institute of Medicine). 2010. Dietary reference    intakes for calcium and D. [place]: The    [employer]. 2. Holick MF, Binkley [place], Bischoff-Ferrari HA, et al.    Evaluation, treatment, and prevention of vitamin D    deficiency: an Endocrine Society clinical practice    guideline. JCEM. 2011 Jul; 96(7):1911-30.    Lymphocytes Absolute  Date Value Ref Range Status  09/17/2015 0.5 (L) 0.7 - 3.1 x10E3/uL Final  04/04/2015 0.6 (L) 0.7 - 3.1 x10E3/uL Final  11/13/2014 0.8 0.7 - 3.1 x10E3/uL Final    11/05/13 MRI brain (with and without contrast) demonstrating: 1. Multiple supratentorial and infratentorial chronic demyelinating plaques.  2. No abnormal enhancing  lesions 3. No significant change from MRI on 05/05/11.  02/22/14 MRI brain (with and without) demonstrating: 1. Multiple periventricular, subcortical, juxtacortical, right pontine and left medullary chronic demyelinating plaques. Some of these are confluent. Some of these are hypointense on T1 views.  2. There are at least 2 small left fronto-parietal DWI hyperintense lesions (isointense on ADC), without enhancement, may represent subacute demyelinating plaques.  3. No abnormal enhancing lesions. 4. Compared to MRI on 11/05/13, there is a new plaque noted in the left frontal region (series 3 image 84; series 8 image 15) with subacute-chronic features, and correlates with patient's recent exacerbation in Nov 2015. Otherwise no significant change.   02/22/14 MRI cervical spine (with and without) demonstrating: 1. The spinal cord is notable chronic demyelinating plaques at the cervico-medullary junction and C2-3 levels. 2. Disc bulging from C2-3 to C6-7. No spinal stenosis or foraminal narrowing. 3. Compared to MRI 12/18/09, the C2-3 plaque is new. The cervico-medullary plaques have decreased in size.  01/31/14 JCV ab - 2.11 (H) positive  10/17/14 MRI brain (with and without) demonstrating: 1. Multiple supratentorial and infratentorial chronic demyelinating plaques. 2. No acute plaques. 3. Compared to prior MRI on 02/22/14, there are no significant new findings, and resolution of prior DWI hyperintensities.   09/25/15 MRI brain (with and without contrast) 1.   There are multiple T2/FLAIR hyperintense foci in the brainstem and hemispheres in a pattern and configuration consistent with chronic demyelinating plaque associated with multiple sclerosis. None of the foci appears to be acute. 2.   Compared to the MRI dated 10/17/2014, there is no interval change. 3.   There are no acute findings.    ASSESSMENT AND PLAN  42 y.o. year old female here with relapsing remitting multiple sclerosis dx'd in  2011. Extensive workup was done in the hospital from October 5-7, 2011.  MRI brain showed multiple intracranial lesions, multiple enhancing lesions, including asymptomatic and symptomatic enhancing lesions.  MRI of the cervical and thoracic spine showed no intrinsic spinal cord lesions. CSF analysis demonstrated greater than 5 oligoclonal bands, with slight elevation in protein.  Extensive testing ruled out other  mimicking conditions (HIV, TSH, ANA, hypercoag panel).  Repeat MRI brain on 02/13/10, showed decrease in lesion size and enhancement. Initially on rebif, now on gileyna since 11/17/10. Then MS exacerbation in Nov 2015, in setting of inconsistent gilenya use in 2015. Then off gilenya in June 2016 due to insurance issues. Now back on gilenya since Aug 2016; but off again in Dec 2017 due to loss of insurance.    MS --> stable Anxiety --> stable Urinary incont --> stable   Dx:  MS (multiple sclerosis) (HCC) - Plan: MR BRAIN W WO CONTRAST, MR CERVICAL SPINE W WO CONTRAST    PLAN:  - check MRI brain and cervical spine (surveillance) - check labs - then may consider gilenya, mayzent, tecfidera, aubagio - continue exercise program   Orders Placed This Encounter  Procedures  . MR BRAIN W WO CONTRAST  . MR CERVICAL SPINE W WO CONTRAST  . CBC with Differential/Platelet  . Comprehensive metabolic panel  . Varicella Zoster Antibody, IgG  . Stratify JCV Ab (w/ Index) w/ Rflx  . Hep B Surface Antibody  . Hep B Surface Antigen  . Hep C Antibody  . Hepatitis B Core AB, Total   Return in about 6 months (around 06/06/2018).    Suanne Marker, MD 12/06/2017, 9:07 AM Certified in Neurology, Neurophysiology and Neuroimaging  [facility] [address] [place], [place] [address] [phone]

## 2017-12-06 NOTE — Progress Notes (Signed)
JCV sample to [facility] box for pick up. sy

## 2017-12-06 NOTE — Telephone Encounter (Signed)
Noted  

## 2017-12-07 LAB — HEPATITIS B SURFACE ANTIBODY,QUALITATIVE: Hep B Surface Ab, Qual: REACTIVE

## 2017-12-07 LAB — CBC WITH DIFFERENTIAL/PLATELET
BASOS: 1 %
Basophils Absolute: 0.1 10*3/uL (ref 0.0–0.2)
EOS (ABSOLUTE): 0.2 10*3/uL (ref 0.0–0.4)
EOS: 3 %
HEMATOCRIT: 39.7 % (ref 34.0–46.6)
Hemoglobin: 13.6 g/dL (ref 11.1–15.9)
IMMATURE GRANS (ABS): 0 10*3/uL (ref 0.0–0.1)
IMMATURE GRANULOCYTES: 0 %
LYMPHS: 14 %
Lymphocytes Absolute: 1.3 10*3/uL (ref 0.7–3.1)
MCH: 31.9 pg (ref 26.6–33.0)
MCHC: 34.3 g/dL (ref 31.5–35.7)
MCV: 93 fL (ref 79–97)
MONOS ABS: 0.8 10*3/uL (ref 0.1–0.9)
Monocytes: 9 %
NEUTROS ABS: 6.9 10*3/uL (ref 1.4–7.0)
NEUTROS PCT: 73 %
Platelets: 300 10*3/uL (ref 150–450)
RBC: 4.26 x10E6/uL (ref 3.77–5.28)
RDW: 12.4 % (ref 12.3–15.4)
WBC: 9.3 10*3/uL (ref 3.4–10.8)

## 2017-12-07 LAB — COMPREHENSIVE METABOLIC PANEL
A/G RATIO: 2.7 — AB (ref 1.2–2.2)
ALT: 27 IU/L (ref 0–32)
AST: 24 IU/L (ref 0–40)
Albumin: 4.9 g/dL (ref 3.5–5.5)
Alkaline Phosphatase: 67 IU/L (ref 39–117)
BUN / CREAT RATIO: 16 (ref 9–23)
BUN: 13 mg/dL (ref 6–24)
Bilirubin Total: 0.3 mg/dL (ref 0.0–1.2)
CALCIUM: 10.3 mg/dL — AB (ref 8.7–10.2)
CO2: 24 mmol/L (ref 20–29)
Chloride: 101 mmol/L (ref 96–106)
Creatinine, Ser: 0.81 mg/dL (ref 0.57–1.00)
GFR, EST AFRICAN AMERICAN: 105 mL/min/{1.73_m2} (ref >59)
GFR, EST NON AFRICAN AMERICAN: 91 mL/min/{1.73_m2} (ref >59)
GLOBULIN, TOTAL: 1.8 g/dL (ref 1.5–4.5)
Glucose: 74 mg/dL (ref 65–99)
POTASSIUM: 4.8 mmol/L (ref 3.5–5.2)
SODIUM: 142 mmol/L (ref 134–144)
TOTAL PROTEIN: 6.7 g/dL (ref 6.0–8.5)

## 2017-12-07 LAB — HEPATITIS B SURFACE ANTIGEN: HEP B S AG: NEGATIVE

## 2017-12-07 LAB — VARICELLA ZOSTER ANTIBODY, IGG: Varicella zoster IgG: 1752 index (ref >165)

## 2017-12-07 LAB — HEPATITIS C ANTIBODY: Hep C Virus Ab: <0.1 s/co ratio (ref 0.0–0.9)

## 2017-12-07 LAB — HEPATITIS B CORE ANTIBODY, TOTAL: Hep B Core Total Ab: NEGATIVE

## 2017-12-12 LAB — STRATIFY JCV AB (W/ INDEX) W/ RFLX
Index Value: 2.51 — ABNORMAL HIGH
STRATIFY JCV (TM) AB W/REFLEX INHIBITION: POSITIVE — AB

## 2017-12-12 NOTE — Telephone Encounter (Signed)
Patient is scheduled at [facility] in [place]  for 12/23/17.

## 2017-12-14 ENCOUNTER — Telehealth: Payer: Self-pay | Admitting: *Deleted

## 2017-12-14 NOTE — Telephone Encounter (Signed)
Spoke with patient and informed her that her labs are unremarkable.  Advised her JCV is still positive. Dr Marjory Lies will continue her current plan. After her MRIs, he may consider consider gilenya, mayzent, tecfidera, aubagio.  She stated her MRIs are 12/23/17. She verbalized understanding, appreciation of call.

## 2017-12-23 ENCOUNTER — Encounter: Payer: Self-pay | Admitting: Diagnostic Neuroimaging

## 2017-12-27 ENCOUNTER — Telehealth: Payer: Self-pay | Admitting: *Deleted

## 2017-12-27 NOTE — Telephone Encounter (Signed)
Per Dr Marjory Lies, spoke with patient this morning and informed her that her MRI brain and MRI cervical spine show no new lesions, chronic MS. She verbalized understanding, appreciation for call.

## 2018-06-12 ENCOUNTER — Telehealth: Payer: Self-pay | Admitting: *Deleted

## 2018-06-12 NOTE — Telephone Encounter (Signed)
Staff LVM for patient earlier today re: cancelled 6 month FU due to COVID 19.  LVM requesting she call back to change FU to video visit. Advised I need to review meds, med hx, etc.

## 2018-06-13 ENCOUNTER — Ambulatory Visit: Payer: BLUE CROSS/BLUE SHIELD | Admitting: Diagnostic Neuroimaging

## 2019-01-09 ENCOUNTER — Other Ambulatory Visit: Payer: Self-pay

## 2019-01-09 ENCOUNTER — Encounter: Payer: Self-pay | Admitting: Diagnostic Neuroimaging

## 2019-01-09 ENCOUNTER — Ambulatory Visit: Payer: BC Managed Care – PPO | Admitting: Diagnostic Neuroimaging

## 2019-01-09 VITALS — BP 108/63 | HR 58 | Temp 98.5°F | Ht 62.0 in | Wt 128.6 lb

## 2019-01-09 DIAGNOSIS — G35 Multiple sclerosis: Secondary | ICD-10-CM | POA: Diagnosis not present

## 2019-01-09 NOTE — Progress Notes (Addendum)
Tecfidera start form signed by patient, on Dr [employer] desk for completion. Tecfidera start form completed, faxed to [place] with office note, copy of insurance cards, demographics.  f [phone]. Received confirmation letter of receiving new start form.

## 2019-01-09 NOTE — Patient Instructions (Signed)
-   start tecfidera

## 2019-01-09 NOTE — Addendum Note (Signed)
Addended by: Inis Sizer D on: 01/09/2019 09:51 AM   Modules accepted: Orders

## 2019-01-09 NOTE — Progress Notes (Signed)
[facility]  PATIENT: Anne Valenzuela DOB: 06-02-1975  REFERRING CLINICIAN:  HISTORY FROM: patient REASON FOR VISIT: follow up   HISTORICAL  CHIEF COMPLAINT:  Chief Complaint  Patient presents with   Multiple Sclerosis    rm 6, "no vision changes; not been on therapy in a long time, every time I do I have a relapse; I am doing well, no concerns"    HISTORY OF PRESENT ILLNESS:   UPDATE (01/09/19, VRP): Since last visit, doing well. Symptoms are stable. Severity is mild. No alleviating or aggravating factors. Tolerating multi-vitamin. No numbness, weakness, vision changes.   UPDATE (12/06/17, VRP): Since last visit, doing WELL. Symptoms are stable. Severity is mild. No alleviating or aggravating factors. Has been off gilenya. Now has insurance again. Doing well. No new numbness, weakness, slurred speech or vision changes. Fatigue continues. Working at [employer]. Using CBD oil for general muscle pain.    UPDATE 03/22/16: Since last visit, patient had medicaid cancelled for some reason. Now off gilenya. No new MS symptoms.   UPDATE 09/17/15: Since last visit, doing well. Now working out at a gym ([employer]) x 2-3 months and feeling benefits. Tolerating gilenya.   UPDATE 04/04/15: Doing well. Tolerating gilenya and multi-vitamin daily. No new issues. Mood, gait, bladder issues stable.   UPDATE 12/16/14: Since last visit, doing well. Back on gilenya since Aug 2016. Anxiety, bladder and gait issues are stable. Overall stable, and satisfied with medication mgmt. Does not want to add any new meds to her regimen.  UPDATE 09/11/14: Since last visit, slightly better. Anxiety and urinary issues better with lifesyle changes. Stopped duloxetine (didn't like side effects). Unfortunately, gilenya coverage stopped due to change in medicaid status. Now off meds x 2 weeks. No new neuro symptoms.   UPDATE 06/07/14: Since last visit, no new neuro symptoms. Anxiety still diff, usually with  specific situations. Today she feels calm. She stopped sertraline due to diarrhea, then anxiety worsened. Now back on sertraline. Having some urinary incont (sudden urge).   UPDATE 03/04/14: Since last visit, she feels better. Sertraline helping with depression and anxiety, but causing mild diarrhea. Gilenya is stable. No new neuro events.   UPDATE 01/31/14: Since last visit, is back on gilenya consistently. 2-3 weeks ago, had right hand numb/weak, slurred speech, bladder incontinence. Symptoms now almost resolved for 3 days. Didn't call us b/c she had this appt planned for today. Also with more depression, anxiety.   UPDATE 10/23/13: Patient last seen in clinic during Prefer[facility] trial (last visit 10/25/11). Was getting gilenya through patient assistance. Did not have insurance, and therefore did not follow up in our clinic. Over last 1 year, was "stretching" out gilenya taking it every other day. Now out of meds x 1 week. Now has insurance and trying to get re-established. No new neuro symptoms. Overall doing well. Enrolled in school (funeral services program).     UPDATE 11/03/10: Doing well.  Tried TPX and indomethacin for HA, but didn't help.  HA have subsided on their own.   Now interested in PreferMS trial.  UPDATE 07/31/10: Has been having headaches (right sided, throbbing; no nausea, vomiting, photo or phono), almost daily but usually 1 hour after rebif injections.  tried fioricet with mild relief.  HA are 5/10 in severity.  Denies new numbness, weakness or vision changes.  No infx sxs.  UPDATE 05/15/10: Started on Rebif in end of Dec 2011.  Some injection site reactions.  Doing about the same.  Weakness essentially  resolved.  Depression sxs better.  UPDATE 03/03/10: Doing much better.  All prior sxs essentially resolved.  Back to driving.  Vision, strength, bladder fx much better.  We discussed multiple sclerosis treatmets.  UPDATE 01/13/10: 43 year old female with possible MS.  Hosp f/u visit.  No  new complaints.  RUE slightly better.  Still with blurred vision, balance diff. Patient's symptoms started in July 2011, with nausea and diagnosis of urinary tract infection. She then had a 20 pound weight loss. The beginning of October 2011, patient developed right arm weakness and clumsiness. She also had fuzzy vision, headache and urge incontinence. She presented to [facility] and was found to have multiple subcortical white matter lesions on CT scan and confirmed on MRI. She had extensive workup with lab testing and spinal. She was treated with IV solumedrol and then oral prednisone.   REVIEW OF SYSTEMS: Full 14 system review of systems performed and negative except: as per HPI.   ALLERGIES: No Known Allergies  HOME MEDICATIONS: Outpatient Medications Prior to Visit  Medication Sig Dispense Refill   Multiple Vitamin (MULTIVITAMIN) tablet Take 1 tablet by mouth daily.     OVER THE COUNTER MEDICATION CBD OIL sublingual drops daily.     Fingolimod HCl (GILENYA) 0.5 MG CAPS Take 1 capsule (0.5 mg total) by mouth daily. (Patient not taking: Reported on 03/22/2016) 30 capsule 12   No facility-administered medications prior to visit.     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    Comment: 03/22/16 works for [employer]  Social Needs   Financial resource strain: Not on file   Food insecurity    Worry: Not on file    Inability: Not on file    Transportation needs    Medical: Not on file    Non-medical: Not on file  Tobacco Use   Smoking status: Never Smoker   Smokeless tobacco: Never Used  Substance and Sexual Activity   Alcohol use: No    Alcohol/week: 0.0 standard drinks   Drug use: No   Sexual activity: Not on file  Lifestyle   Physical activity    Days per week: Not on file    Minutes per session: Not on file   Stress: Not on file  Relationships   Social connections    Talks on phone: Not on file    Gets together: Not on file    Attends religious service: Not on file    Active member of club or organization: Not on file    Attends meetings of clubs or organizations: Not on file    Relationship status: Not on file   Intimate partner violence    Fear of current or ex partner: Not on file    Emotionally abused: Not on file    Physically abused: Not on file    Forced sexual activity: Not  on file  Other Topics Concern   Not on file  Social History Narrative   Patient lives at home with her daughter.   Caffeine Use: tea 3 cups daily     PHYSICAL EXAM  GENERAL EXAM/CONSTITUTIONAL: Vitals:  Vitals:   01/09/19 0843  BP: 108/63  Pulse: (!) 58  Temp: 98.5 F (36.9 C)  Weight: 128 lb 9.6 oz (58.3 kg)  Height: 5\' 2"  (1.575 m)   Body mass index is 23.52 kg/m. Wt Readings from Last 3 Encounters:  01/09/19 128 lb 9.6 oz (58.3 kg)  12/06/17 121 lb 6.4 oz (55.1 kg)  03/22/16 134 lb (60.8 kg)    Patient is in no distress; well developed, nourished and groomed; neck is supple  CARDIOVASCULAR:  Examination of carotid arteries is normal; no carotid bruits  Regular rate and rhythm, no murmurs  Examination of peripheral vascular system by observation and palpation is normal  EYES:  Ophthalmoscopic exam of optic discs and posterior segments is normal; no papilledema or hemorrhages No exam data present  MUSCULOSKELETAL:  Gait, strength, tone, movements noted in Neurologic exam  below  NEUROLOGIC: MENTAL STATUS:  No flowsheet data found.  awake, alert, oriented to person, place and time  recent and remote memory intact  normal attention and concentration  language fluent, comprehension intact, naming intact  fund of knowledge appropriate  CRANIAL NERVE:   2nd - no papilledema on fundoscopic exam  2nd, 3rd, 4th, 6th - pupils equal and reactive to light, visual fields full to confrontation, extraocular muscles intact, no nystagmus  5th - facial sensation symmetric  7th - facial strength symmetric  8th - hearing intact  9th - palate elevates symmetrically, uvula midline  11th - shoulder shrug symmetric  12th - tongue protrusion midline  MOTOR:   normal bulk and tone, full strength in the BUE, BLE  SENSORY:   normal and symmetric to light touch, temperature  COORDINATION:   finger-nose-finger, fine finger movements normal  REFLEXES:   deep tendon reflexes present and symmetric  GAIT/STATION:   narrow based gait; ABLE TO WALK TANDEM      DIAGNOSTIC DATA (LABS, IMAGING, TESTING) - I reviewed patient records, labs, notes, testing and imaging myself where available.  Lab Results  Component Value Date   WBC 9.3 12/06/2017   HGB 13.6 12/06/2017   HCT 39.7 12/06/2017   MCV 93 12/06/2017   PLT 300 12/06/2017      Component Value Date/Time   NA 142 12/06/2017 0947   K 4.8 12/06/2017 0947   CL 101 12/06/2017 0947   CO2 24 12/06/2017 0947   GLUCOSE 74 12/06/2017 0947   GLUCOSE 90 12/17/2009 1302   BUN 13 12/06/2017 0947   CREATININE 0.81 12/06/2017 0947   CALCIUM 10.3 (H) 12/06/2017 0947   PROT 6.7 12/06/2017 0947   ALBUMIN 4.9 12/06/2017 0947   AST 24 12/06/2017 0947   ALT 27 12/06/2017 0947   ALKPHOS 67 12/06/2017 0947   BILITOT 0.3 12/06/2017 0947   GFRNONAA 91 12/06/2017 0947   GFRAA 105 12/06/2017 0947   Lab Results  Component Value Date   CHOL  12/18/2009    147        ATP III CLASSIFICATION:  <200      mg/dL   Desirable  200-239  mg/dL   Borderline High  >=240    mg/dL   High          HDL 55 12/18/2009   [place]  12/18/2009  80        Total Cholesterol/HDL:CHD Risk Coronary Heart Disease Risk Table                     Men   Women  1/2 Average Risk   3.4   3.3  Average Risk       5.0   4.4  2 X Average Risk   9.6   7.1  3 X Average Risk  23.4   11.0        Use the calculated Patient Ratio above and the CHD Risk Table to determine the patient's CHD Risk.        ATP III CLASSIFICATION (LDL):  <100     mg/dL   Optimal  952-841100-129  mg/dL   Near or Above                    Optimal  130-159  mg/dL   Borderline  324-401160-189  mg/dL   High  >027>190     mg/dL   Very High   TRIG 58 25/36/644010/08/2009   CHOLHDL 2.7 12/18/2009   Lab Results  Component Value Date   HGBA1C  12/18/2009    5.3 (NOTE)                                                                       According to the ADA Clinical Practice Recommendations for 2011, when HbA1c is used as a screening test:   >=6.5%   Diagnostic of Diabetes Mellitus           (if abnormal result  is confirmed)  5.7-6.4%   Increased risk of developing Diabetes Mellitus  References:Diagnosis and Classification of Diabetes Mellitus,Diabetes Care,2011,34(Suppl 1):S62-S69 and Standards of Medical Care in         Diabetes - 2011,Diabetes Care,2011,34  (Suppl 1):S11-S61.   No results found for: VITAMINB12 Lab Results  Component Value Date   TSH 3.463 12/18/2009   Vit D, 25-Hydroxy  Date Value Ref Range Status  09/17/2015 56.3 30.0 - 100.0 ng/mL Final    Comment:    Vitamin D deficiency has been defined by the Institute of Medicine and an Endocrine Society practice guideline as a level of serum 25-OH vitamin D less than 20 ng/mL (1,2). The Endocrine Society went on to further define vitamin D insufficiency as a level between 21 and 29 ng/mL (2). 1. IOM (Institute of Medicine). 2010. Dietary reference    intakes for calcium and D. [place]: The     [employer]. 2. Holick MF, Binkley [place], Bischoff-Ferrari HA, et al.    Evaluation, treatment, and prevention of vitamin D    deficiency: an Endocrine Society clinical practice    guideline. JCEM. 2011 Jul; 96(7):1911-30.   04/04/2015 35.6 30.0 - 100.0 ng/mL Final    Comment:    Vitamin D deficiency has been defined by the Institute of Medicine and an Endocrine Society practice guideline as a level of serum 25-OH vitamin D less than 20 ng/mL (1,2). The Endocrine Society went on to further define vitamin D insufficiency as a level between 21 and 29 ng/mL (2). 1. IOM (Institute of Medicine). 2010. Dietary reference    intakes for calcium and  D. [place]: The    [employer]. 2. Holick MF, Binkley [place], Bischoff-Ferrari HA, et al.    Evaluation, treatment, and prevention of vitamin D    deficiency: an Endocrine Society clinical practice    guideline. JCEM. 2011 Jul; 96(7):1911-30.   01/31/2014 39.1 30.0 - 100.0 ng/mL Final    Comment:    Vitamin D deficiency has been defined by the Institute of Medicine and an Endocrine Society practice guideline as a level of serum 25-OH vitamin D less than 20 ng/mL (1,2). The Endocrine Society went on to further define vitamin D insufficiency as a level between 21 and 29 ng/mL (2). 1. IOM (Institute of Medicine). 2010. Dietary reference    intakes for calcium and D. [place]: The    [employer]. 2. Holick MF, Binkley [place], Bischoff-Ferrari HA, et al.    Evaluation, treatment, and prevention of vitamin D    deficiency: an Endocrine Society clinical practice    guideline. JCEM. 2011 Jul; 96(7):1911-30.    Lymphocytes Absolute  Date Value Ref Range Status  12/06/2017 1.3 0.7 - 3.1 x10E3/uL Final  09/17/2015 0.5 (L) 0.7 - 3.1 x10E3/uL Final  04/04/2015 0.6 (L) 0.7 - 3.1 x10E3/uL Final    11/05/13 MRI brain (with and without contrast) demonstrating: 1. Multiple supratentorial and infratentorial chronic  demyelinating plaques.  2. No abnormal enhancing lesions 3. No significant change from MRI on 05/05/11.  02/22/14 MRI brain (with and without) demonstrating: 1. Multiple periventricular, subcortical, juxtacortical, right pontine and left medullary chronic demyelinating plaques. Some of these are confluent. Some of these are hypointense on T1 views.  2. There are at least 2 small left fronto-parietal DWI hyperintense lesions (isointense on ADC), without enhancement, may represent subacute demyelinating plaques.  3. No abnormal enhancing lesions. 4. Compared to MRI on 11/05/13, there is a new plaque noted in the left frontal region (series 3 image 84; series 8 image 15) with subacute-chronic features, and correlates with patient's recent exacerbation in Nov 2015. Otherwise no significant change.   02/22/14 MRI cervical spine (with and without) demonstrating: 1. The spinal cord is notable chronic demyelinating plaques at the cervico-medullary junction and C2-3 levels. 2. Disc bulging from C2-3 to C6-7. No spinal stenosis or foraminal narrowing. 3. Compared to MRI 12/18/09, the C2-3 plaque is new. The cervico-medullary plaques have decreased in size.  01/31/14 JCV ab - 2.11 (H) positive  10/17/14 MRI brain (with and without) demonstrating: 1. Multiple supratentorial and infratentorial chronic demyelinating plaques. 2. No acute plaques. 3. Compared to prior MRI on 02/22/14, there are no significant new findings, and resolution of prior DWI hyperintensities.   09/25/15 MRI brain (with and without contrast) 1.   There are multiple T2/FLAIR hyperintense foci in the brainstem and hemispheres in a pattern and configuration consistent with chronic demyelinating plaque associated with multiple sclerosis. None of the foci appears to be acute. 2.   Compared to the MRI dated 10/17/2014, there is no interval change. 3.   There are no acute findings.  12/23/17 MRI brain  - supratentorial and infratentorial  chronic demyelinating plaques - no acute plaques  12/23/17 MRI cervical spine  - chronic plaque at C2-3 - no acute plaques    ASSESSMENT AND PLAN  43 y.o. year old female here with relapsing remitting multiple sclerosis dx'd in 2011. Extensive workup was done in the hospital from October 5-7, 2011.  MRI brain showed multiple intracranial lesions, multiple enhancing lesions, including asymptomatic and symptomatic enhancing lesions.  MRI  of the cervical and thoracic spine showed no intrinsic spinal cord lesions. CSF analysis demonstrated greater than 5 oligoclonal bands, with slight elevation in protein.  Extensive testing ruled out other mimicking conditions (HIV, TSH, ANA, hypercoag panel).  Repeat MRI brain on 02/13/10, showed decrease in lesion size and enhancement. Initially on rebif, now on gileyna since 11/17/10. Then MS exacerbation in Nov 2015, in setting of inconsistent gilenya use in 2015. Then off gilenya in June 2016 due to insurance issues. Now back on gilenya since Aug 2016; but off again in Dec 2017 due to loss of insurance.    MS --> stable Anxiety --> stable Urinary incont --> stable   Dx:  MS (multiple sclerosis) (HCC)    PLAN:  MULTIPLE SCLEROSIS (stable) - start tecfidera (may consider ocrevus in future) - check labs - continue exercise program - continue multi-vitamin support   Orders Placed This Encounter  Procedures   CBC with Differential/Platelet   Comprehensive metabolic panel   VITAMIN D 25 Hydroxy (Vit-D Deficiency, Fractures)   Return in about 6 months (around 07/10/2019).    Suanne MarkerVIKRAM R. Cloey Sferrazza, MD 01/09/2019, 9:15 AM Certified in Neurology, Neurophysiology and Neuroimaging  [facility] [address], [address] [place], [place] [id number] [phone]

## 2019-01-10 LAB — VITAMIN D 25 HYDROXY (VIT D DEFICIENCY, FRACTURES): Vit D, 25-Hydroxy: 54.8 ng/mL (ref 30.0–100.0)

## 2019-01-10 LAB — CBC WITH DIFFERENTIAL/PLATELET
Basophils Absolute: 0.1 10*3/uL (ref 0.0–0.2)
Basos: 1 %
EOS (ABSOLUTE): 0.2 10*3/uL (ref 0.0–0.4)
Eos: 2 %
Hematocrit: 41 % (ref 34.0–46.6)
Hemoglobin: 13.7 g/dL (ref 11.1–15.9)
Immature Grans (Abs): 0 10*3/uL (ref 0.0–0.1)
Immature Granulocytes: 0 %
Lymphocytes Absolute: 1.4 10*3/uL (ref 0.7–3.1)
Lymphs: 14 %
MCH: 31.4 pg (ref 26.6–33.0)
MCHC: 33.4 g/dL (ref 31.5–35.7)
MCV: 94 fL (ref 79–97)
Monocytes Absolute: 0.8 10*3/uL (ref 0.1–0.9)
Monocytes: 8 %
Neutrophils Absolute: 7.8 10*3/uL — ABNORMAL HIGH (ref 1.4–7.0)
Neutrophils: 75 %
Platelets: 271 10*3/uL (ref 150–450)
RBC: 4.37 x10E6/uL (ref 3.77–5.28)
RDW: 11.9 % (ref 11.7–15.4)
WBC: 10.3 10*3/uL (ref 3.4–10.8)

## 2019-01-10 LAB — COMPREHENSIVE METABOLIC PANEL
ALT: 19 IU/L (ref 0–32)
AST: 16 IU/L (ref 0–40)
Albumin/Globulin Ratio: 2.9 — ABNORMAL HIGH (ref 1.2–2.2)
Albumin: 4.9 g/dL — ABNORMAL HIGH (ref 3.8–4.8)
Alkaline Phosphatase: 63 IU/L (ref 39–117)
BUN/Creatinine Ratio: 18 (ref 9–23)
BUN: 12 mg/dL (ref 6–24)
Bilirubin Total: 0.3 mg/dL (ref 0.0–1.2)
CO2: 25 mmol/L (ref 20–29)
Calcium: 10.6 mg/dL — ABNORMAL HIGH (ref 8.7–10.2)
Chloride: 106 mmol/L (ref 96–106)
Creatinine, Ser: 0.65 mg/dL (ref 0.57–1.00)
GFR calc Af Amer: 127 mL/min/{1.73_m2} (ref >59)
GFR calc non Af Amer: 110 mL/min/{1.73_m2} (ref >59)
Globulin, Total: 1.7 g/dL (ref 1.5–4.5)
Glucose: 104 mg/dL — ABNORMAL HIGH (ref 65–99)
Potassium: 4.9 mmol/L (ref 3.5–5.2)
Sodium: 143 mmol/L (ref 134–144)
Total Protein: 6.6 g/dL (ref 6.0–8.5)

## 2019-01-17 ENCOUNTER — Telehealth: Payer: Self-pay | Admitting: *Deleted

## 2019-01-17 ENCOUNTER — Encounter: Payer: Self-pay | Admitting: *Deleted

## 2019-01-17 NOTE — Telephone Encounter (Signed)
Received fax from [place] pharmacy: Tecfidera approved 01/16/2019 through 01/16/2020.

## 2019-01-22 NOTE — Telephone Encounter (Signed)
Received fax from [facility] re: Makena shipped. Contact p [phone], f [phone].

## 2019-04-03 ENCOUNTER — Telehealth: Payer: Self-pay | Admitting: *Deleted

## 2019-04-03 NOTE — Telephone Encounter (Signed)
Received fax from [facility]: [employer] Report. Nurse educator was unable to reach patient with 3 attempts for welcome call-01/25/19, treatment start call- 02/06/19.  Reached her on 03/29/19 for follow up call. Patient informed RN she will not start Tecfidera and that dr is aware. Called patient re: no documentation that she let MD know she wasn't starting Tecfidera. LVM about above and requesting she call back to discuss.

## 2019-04-03 NOTE — Telephone Encounter (Signed)
Per phone staff, she called this morning but call was ut off. Called and LVM #2 requesting a call back. I advised her our office was having phone issues this morning. Left #.

## 2019-04-17 NOTE — Telephone Encounter (Signed)
Called patient again, LVM requesting she call back to discuss Tecfidera. Reminded her of FU in April. Left office #.

## 2019-07-10 ENCOUNTER — Ambulatory Visit: Payer: BC Managed Care – PPO | Admitting: Diagnostic Neuroimaging

## 2019-07-10 ENCOUNTER — Encounter: Payer: Self-pay | Admitting: Diagnostic Neuroimaging

## 2019-07-10 ENCOUNTER — Other Ambulatory Visit: Payer: Self-pay

## 2019-07-10 VITALS — BP 116/64 | HR 64 | Temp 97.9°F | Ht 62.0 in | Wt 133.6 lb

## 2019-07-10 DIAGNOSIS — G35 Multiple sclerosis: Secondary | ICD-10-CM | POA: Diagnosis not present

## 2019-07-10 NOTE — Patient Instructions (Signed)
MULTIPLE SCLEROSIS (stable) - check MRI brain (surveillance) - consider copaxone, tecfidera, aubagio, ocrevus (patient will think about and let us know) - continue exercise program - continue multi-vitamin support

## 2019-07-10 NOTE — Progress Notes (Addendum)
[facility]  PATIENT: Anne Valenzuela DOB: 07/21/1975  REFERRING CLINICIAN:  HISTORY FROM: patient REASON FOR VISIT: follow up   HISTORICAL  CHIEF COMPLAINT:  Chief Complaint  Patient presents with  . Multiple Sclerosis    rm 7, 6 month FU "I never started Tecfidera, I am doing well"    HISTORY OF PRESENT ILLNESS:   UPDATE (07/10/19, VRP): Since last visit, doing well; not started tecfidera. Symptoms are stable. Some intermittent right foot numbness. No alleviating or aggravating factors. Tolerating vitamins.    UPDATE (01/09/19, VRP): Since last visit, doing well. Symptoms are stable. Severity is mild. No alleviating or aggravating factors. Tolerating multi-vitamin. No numbness, weakness, vision changes.   UPDATE (12/06/17, VRP): Since last visit, doing WELL. Symptoms are stable. Severity is mild. No alleviating or aggravating factors. Has been off gilenya. Now has insurance again. Doing well. No new numbness, weakness, slurred speech or vision changes. Fatigue continues. Working at [employer]. Using CBD oil for general muscle pain.    UPDATE 03/22/16: Since last visit, patient had medicaid cancelled for some reason. Now off gilenya. No new MS symptoms.   UPDATE 09/17/15: Since last visit, doing well. Now working out at a gym ([employer]) x 2-3 months and feeling benefits. Tolerating gilenya.   UPDATE 04/04/15: Doing well. Tolerating gilenya and multi-vitamin daily. No new issues. Mood, gait, bladder issues stable.   UPDATE 12/16/14: Since last visit, doing well. Back on gilenya since Aug 2016. Anxiety, bladder and gait issues are stable. Overall stable, and satisfied with medication mgmt. Does not want to add any new meds to her regimen.  UPDATE 09/11/14: Since last visit, slightly better. Anxiety and urinary issues better with lifesyle changes. Stopped duloxetine (didn't like side effects). Unfortunately, gilenya coverage stopped due to change in medicaid status. Now  off meds x 2 weeks. No new neuro symptoms.   UPDATE 06/07/14: Since last visit, no new neuro symptoms. Anxiety still diff, usually with specific situations. Today she feels calm. She stopped sertraline due to diarrhea, then anxiety worsened. Now back on sertraline. Having some urinary incont (sudden urge).   UPDATE 03/04/14: Since last visit, she feels better. Sertraline helping with depression and anxiety, but causing mild diarrhea. Gilenya is stable. No new neuro events.   UPDATE 01/31/14: Since last visit, is back on gilenya consistently. 2-3 weeks ago, had right hand numb/weak, slurred speech, bladder incontinence. Symptoms now almost resolved for 3 days. Didn't call us b/c she had this appt planned for today. Also with more depression, anxiety.   UPDATE 10/23/13: Patient last seen in clinic during PreferMS trial (last visit 10/25/11). Was getting gilenya through patient assistance. Did not have insurance, and therefore did not follow up in our clinic. Over last 1 year, was "stretching" out gilenya taking it every other day. Now out of meds x 1 week. Now has insurance and trying to get re-established. No new neuro symptoms. Overall doing well. Enrolled in school (funeral services program).     UPDATE 11/03/10: Doing well.  Tried TPX and indomethacin for HA, but didn't help.  HA have subsided on their own.   Now interested in PreferMS trial.  UPDATE 07/31/10: Has been having headaches (right sided, throbbing; no nausea, vomiting, photo or phono), almost daily but usually 1 hour after rebif injections.  tried fioricet with mild relief.  HA are 5/10 in severity.  Denies new numbness, weakness or vision changes.  No infx sxs.  UPDATE 05/15/10: Started on Rebif in end of  Dec 2011.  Some injection site reactions.  Doing about the same.  Weakness essentially resolved.  Depression sxs better.  UPDATE 03/03/10: Doing much better.  All prior sxs essentially resolved.  Back to driving.  Vision, strength, bladder  fx much better.  We discussed multiple sclerosis treatmets.  UPDATE 01/13/10: 44 year old female with possible MS.  Hosp f/u visit.  No new complaints.  RUE slightly better.  Still with blurred vision, balance diff. Patient's symptoms started in July 2011, with nausea and diagnosis of urinary tract infection. She then had a 20 pound weight loss. The beginning of October 2011, patient developed right arm weakness and clumsiness. She also had fuzzy vision, headache and urge incontinence. She presented to [facility] and was found to have multiple subcortical white matter lesions on CT scan and confirmed on MRI. She had extensive workup with lab testing and spinal. She was treated with IV solumedrol and then oral prednisone.   REVIEW OF SYSTEMS: Full 14 system review of systems performed and negative except: as per HPI.   ALLERGIES: No Known Allergies  HOME MEDICATIONS: Outpatient Medications Prior to Visit  Medication Sig Dispense Refill  . Multiple Vitamin (MULTIVITAMIN) tablet Take 1 tablet by mouth daily.    [name] Kitchen OVER THE COUNTER MEDICATION CBD OIL sublingual drops daily.    . TECFIDERA 120 & 240 MG MISC Take 1 capsule by mouth 2 (two) times daily. approved 01/16/2019 through 01/16/2020.     No facility-administered medications prior to visit.    PAST MEDICAL HISTORY: Past Medical History:  Diagnosis Date  . MS (multiple sclerosis) (HCC)     PAST SURGICAL HISTORY: Past Surgical History:  Procedure Laterality Date  . NO PAST SURGERIES      FAMILY HISTORY: Family History  Problem Relation Age of Onset  . Diabetes Mother     SOCIAL HISTORY:  Social History   Socioeconomic History  . Marital status: Married    Spouse name: Not on file  . Number of children: 1  . Years of education: HS  . 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 Smoking  status: Never Smoker  . Smokeless tobacco: Never Used  Substance and Sexual Activity  . Alcohol use: No    Alcohol/week: 0.0 standard drinks  . Drug use: No  . Sexual activity: Not on file  Other Topics Concern  . Not on file  Social History Narrative   Patient lives at home with her daughter.   Caffeine Use: tea 3 cups daily   Social Determinants of Health   Financial Resource Strain:   . Difficulty of Paying Living Expenses:   Food Insecurity:   . Worried About Programme researcher, broadcasting/film/video in the Last Year:   . Barista in the Last Year:   Transportation Needs:   . Freight forwarder (Medical):   [name] Kitchen Lack of Transportation (Non-Medical):   Physical Activity:   . Days of Exercise per Week:   . Minutes of Exercise per Session:   Stress:   . Feeling of Stress :   Social Connections:   . Frequency of Communication with Friends and Family:   . Frequency of Social Gatherings with Friends and Family:   . Attends Religious Services:   . Active Member of Clubs or Organizations:   . Attends Club  or Organization Meetings:   [name] Kitchen Marital Status:   Intimate Partner Violence:   . Fear of Current or Ex-Partner:   . Emotionally Abused:   [name] Kitchen Physically Abused:   . Sexually Abused:      PHYSICAL EXAM  GENERAL EXAM/CONSTITUTIONAL: Vitals:  Vitals:   07/10/19 0830  BP: 116/64  Pulse: 64  Temp: 97.9 F (36.6 C)  Weight: 133 lb 9.6 oz (60.6 kg)  Height:  (1.575 m)   Body mass index is 24.44 kg/m. Wt Readings from Last 3 Encounters:  07/10/19 133 lb 9.6 oz (60.6 kg)  01/09/19 128 lb 9.6 oz (58.3 kg)  12/06/17 121 lb 6.4 oz (55.1 kg)    Patient is in no distress; well developed, nourished and groomed; neck is supple  CARDIOVASCULAR:  Examination of carotid arteries is normal; no carotid bruits  Regular rate and rhythm, no murmurs  Examination of peripheral vascular system by observation and palpation is normal  EYES:  Ophthalmoscopic exam of optic discs and  posterior segments is normal; no papilledema or hemorrhages No exam data present  MUSCULOSKELETAL:  Gait, strength, tone, movements noted in Neurologic exam below  NEUROLOGIC: MENTAL STATUS:  No flowsheet data found.  awake, alert, oriented to person, place and time  recent and remote memory intact  normal attention and concentration  language fluent, comprehension intact, naming intact  fund of knowledge appropriate  CRANIAL NERVE:   2nd - no papilledema on fundoscopic exam  2nd, 3rd, 4th, 6th - pupils equal and reactive to light, visual fields full to confrontation, extraocular muscles intact, no nystagmus  5th - facial sensation symmetric  7th - facial strength symmetric  8th - hearing intact  9th - palate elevates symmetrically, uvula midline  11th - shoulder shrug symmetric  12th - tongue protrusion midline  MOTOR:   normal bulk and tone, full strength in the BUE, BLE  SENSORY:   normal and symmetric to light touch, temperature  COORDINATION:   finger-nose-finger, fine finger movements normal  REFLEXES:   deep tendon reflexes present and symmetric  GAIT/STATION:   narrow based gait; ABLE TO WALK TANDEM      DIAGNOSTIC DATA (LABS, IMAGING, TESTING) - I reviewed patient records, labs, notes, testing and imaging myself where available.  Lab Results  Component Value Date   WBC 10.3 01/09/2019   HGB 13.7 01/09/2019   HCT 41.0 01/09/2019   MCV 94 01/09/2019   PLT 271 01/09/2019      Component Value Date/Time   NA 143 01/09/2019 0953   K 4.9 01/09/2019 0953   CL 106 01/09/2019 0953   CO2 25 01/09/2019 0953   GLUCOSE 104 (H) 01/09/2019 0953   GLUCOSE 90 12/17/2009 1302   BUN 12 01/09/2019 0953   CREATININE 0.65 01/09/2019 0953   CALCIUM 10.6 (H) 01/09/2019 0953   PROT 6.6 01/09/2019 0953   ALBUMIN 4.9 (H) 01/09/2019 0953   AST 16 01/09/2019 0953   ALT 19 01/09/2019 0953   ALKPHOS 63 01/09/2019 0953   BILITOT 0.3 01/09/2019 0953    GFRNONAA 110 01/09/2019 0953   GFRAA 127 01/09/2019 0953   Lab Results  Component Value Date   CHOL  12/18/2009    147        ATP III CLASSIFICATION:  <200     mg/dL   Desirable  409-811  mg/dL   Borderline High  >=914    mg/dL   High          HDL 55 12/18/2009  LDLCALC  12/18/2009    80        Total Cholesterol/HDL:CHD Risk Coronary Heart Disease Risk Table                     Men   Women  1/2 Average Risk   3.4   3.3  Average Risk       5.0   4.4  2 X Average Risk   9.6   7.1  3 X Average Risk  23.4   11.0        Use the calculated Patient Ratio above and the CHD Risk Table to determine the patient's CHD Risk.        ATP III CLASSIFICATION (LDL):  <100     mg/dL   Optimal  810-175  mg/dL   Near or Above                    Optimal  130-159  mg/dL   Borderline  102-585  mg/dL   High  >277     mg/dL   Very High   TRIG 58 82/42/3536   CHOLHDL 2.7 12/18/2009   Lab Results  Component Value Date   HGBA1C  12/18/2009    5.3 (NOTE)                                                                       According to the ADA Clinical Practice Recommendations for 2011, when HbA1c is used as a screening test:   >=6.5%   Diagnostic of Diabetes Mellitus           (if abnormal result  is confirmed)  5.7-6.4%   Increased risk of developing Diabetes Mellitus  References:Diagnosis and Classification of Diabetes Mellitus,Diabetes Care,2011,34(Suppl 1):S62-S69 and Standards of Medical Care in         Diabetes - 2011,Diabetes Care,2011,34  (Suppl 1):S11-S61.   No results found for: VITAMINB12 Lab Results  Component Value Date   TSH 3.463 12/18/2009   Vit D, 25-Hydroxy  Date Value Ref Range Status  01/09/2019 54.8 30.0 - 100.0 ng/mL Final    Comment:    Vitamin D deficiency has been defined by the Institute of Medicine and an Endocrine Society practice guideline as a level of serum 25-OH vitamin D less than 20 ng/mL (1,2). The Endocrine Society went on to further define vitamin  D insufficiency as a level between 21 and 29 ng/mL (2). 1. IOM (Institute of Medicine). 2010. Dietary reference    intakes for calcium and D. [place]: The    [employer]. 2. Holick MF, Binkley Kieler, Bischoff-Ferrari HA, et al.    Evaluation, treatment, and prevention of vitamin D    deficiency: an Endocrine Society clinical practice    guideline. JCEM. 2011 Jul; 96(7):1911-30.   09/17/2015 56.3 30.0 - 100.0 ng/mL Final    Comment:    Vitamin D deficiency has been defined by the Institute of Medicine and an Endocrine Society practice guideline as a level of serum 25-OH vitamin D less than 20 ng/mL (1,2). The Endocrine Society went on to further define vitamin D insufficiency as a level between 21 and 29 ng/mL (2). 1. IOM (Institute of Medicine). 2010. Dietary reference  intakes for calcium and D. [place]: The    [employer]. 2. Holick MF, Binkley [place], Bischoff-Ferrari HA, et al.    Evaluation, treatment, and prevention of vitamin D    deficiency: an Endocrine Society clinical practice    guideline. JCEM. 2011 Jul; 96(7):1911-30.   04/04/2015 35.6 30.0 - 100.0 ng/mL Final    Comment:    Vitamin D deficiency has been defined by the Institute of Medicine and an Endocrine Society practice guideline as a level of serum 25-OH vitamin D less than 20 ng/mL (1,2). The Endocrine Society went on to further define vitamin D insufficiency as a level between 21 and 29 ng/mL (2). 1. IOM (Institute of Medicine). 2010. Dietary reference    intakes for calcium and D. [place]: The    [employer]. 2. Holick MF, Binkley [place], Bischoff-Ferrari HA, et al.    Evaluation, treatment, and prevention of vitamin D    deficiency: an Endocrine Society clinical practice    guideline. JCEM. 2011 Jul; 96(7):1911-30.    Lymphocytes Absolute  Date Value Ref Range Status  01/09/2019 1.4 0.7 - 3.1 x10E3/uL Final  12/06/2017 1.3 0.7 - 3.1 x10E3/uL Final   09/17/2015 0.5 (L) 0.7 - 3.1 x10E3/uL Final    11/05/13 MRI brain (with and without contrast) demonstrating: 1. Multiple supratentorial and infratentorial chronic demyelinating plaques.  2. No abnormal enhancing lesions 3. No significant change from MRI on 05/05/11.  02/22/14 MRI brain (with and without) demonstrating: 1. Multiple periventricular, subcortical, juxtacortical, right pontine and left medullary chronic demyelinating plaques. Some of these are confluent. Some of these are hypointense on T1 views.  2. There are at least 2 small left fronto-parietal DWI hyperintense lesions (isointense on ADC), without enhancement, may represent subacute demyelinating plaques.  3. No abnormal enhancing lesions. 4. Compared to MRI on 11/05/13, there is a new plaque noted in the left frontal region (series 3 image 84; series 8 image 15) with subacute-chronic features, and correlates with patient's recent exacerbation in Nov 2015. Otherwise no significant change.   02/22/14 MRI cervical spine (with and without) demonstrating: 1. The spinal cord is notable chronic demyelinating plaques at the cervico-medullary junction and C2-3 levels. 2. Disc bulging from C2-3 to C6-7. No spinal stenosis or foraminal narrowing. 3. Compared to MRI 12/18/09, the C2-3 plaque is new. The cervico-medullary plaques have decreased in size.  01/31/14 JCV ab - 2.11 (H) positive  10/17/14 MRI brain (with and without) demonstrating: 1. Multiple supratentorial and infratentorial chronic demyelinating plaques. 2. No acute plaques. 3. Compared to prior MRI on 02/22/14, there are no significant new findings, and resolution of prior DWI hyperintensities.   09/25/15 MRI brain (with and without contrast) 1.   There are multiple T2/FLAIR hyperintense foci in the brainstem and hemispheres in a pattern and configuration consistent with chronic demyelinating plaque associated with multiple sclerosis. None of the foci appears to be  acute. 2.   Compared to the MRI dated 10/17/2014, there is no interval change. 3.   There are no acute findings.  12/23/17 MRI brain  - supratentorial and infratentorial chronic demyelinating plaques - no acute plaques  12/23/17 MRI cervical spine  - chronic plaque at C2-3 - no acute plaques    ASSESSMENT AND PLAN  44 y.o. year old female here with relapsing remitting multiple sclerosis dx'd in 2011. Extensive workup was done in the hospital from October 5-7, 2011.  MRI brain showed multiple intracranial lesions, multiple enhancing lesions, including asymptomatic and symptomatic enhancing  lesions.  MRI of the cervical and thoracic spine showed no intrinsic spinal cord lesions. CSF analysis demonstrated greater than 5 oligoclonal bands, with slight elevation in protein.  Extensive testing ruled out other mimicking conditions (HIV, TSH, ANA, hypercoag panel).  Repeat MRI brain on 02/13/10, showed decrease in lesion size and enhancement. Initially on rebif, now on gileyna since 11/17/10. Then MS exacerbation in Nov 2015, in setting of inconsistent gilenya use in 2015. Then off gilenya in June 2016 due to insurance issues. Now back on gilenya since Aug 2016; but off again in Dec 2017 due to loss of insurance. Offered to restart disease modifying therapy, but patient concerned about safety and side effects.   MS --> stable Anxiety --> stable Urinary incont --> stable   Dx:  MS (multiple sclerosis) (HCC)    PLAN:  MULTIPLE SCLEROSIS (relapsing, remitting multiple sclerosis; stable) - check MRI brain (surveillance) - consider copaxone, tecfidera, aubagio, ocrevus (patient will think about and let us know) - continue exercise program - continue multi-vitamin support   Orders Placed This Encounter  Procedures  . MR BRAIN W WO CONTRAST   Return in about 1 year (around 07/09/2020).    Penni Bombard, MD 07/16/5463, 6:81 AM Certified in Neurology, Neurophysiology and  Neuroimaging  [facility] [address], [place], [place] [address] [phone]

## 2019-07-16 ENCOUNTER — Telehealth: Payer: Self-pay | Admitting: Diagnostic Neuroimaging

## 2019-07-16 NOTE — Telephone Encounter (Signed)
pt has [facility] i faxed the order they will reach out to the patient to schedule.

## 2019-07-17 NOTE — Telephone Encounter (Signed)
Patient is scheduled at [facility] for 08/10/19. Their phone number is [phone].

## 2019-08-14 ENCOUNTER — Telehealth: Payer: Self-pay | Admitting: *Deleted

## 2019-08-14 ENCOUNTER — Encounter: Payer: Self-pay | Admitting: *Deleted

## 2019-08-14 DIAGNOSIS — G35 Multiple sclerosis: Secondary | ICD-10-CM

## 2019-08-14 NOTE — Telephone Encounter (Signed)
Received report form [facility], MRI brain. Placed on MD's desk for review.

## 2019-08-14 NOTE — Telephone Encounter (Signed)
Sent my chart advising her lab orders are in.

## 2019-08-14 NOTE — Telephone Encounter (Signed)
Per Dr Marjory Lies, called patient and informed her Dr Marjory Lies reviewed MRI brain report for [facility]. He stated the MRI brain showed new lesions. She is agreeable to start MS therapy and said she prefers Ocrevus infusions to taking daily pills. I advised her she'll have to have labs drawn before signing start form; I'll let MD know she wants to start Ocrevus.  I'll message her when lab orders are placed.  Patient verbalized understanding, appreciation. [name] Kitchen

## 2019-08-14 NOTE — Telephone Encounter (Signed)
Orders Placed This Encounter  Procedures  . Hep B Surface Antibody  . Hep B Surface Antigen  . Hepatitis B Core AB, Total  . QuantiFERON-TB Gold Plus  . Hep C Antibody  . HIV antibody (with reflex)  . Varicella Zoster Antibody, IgG  . CBC with Differential/Platelet  . Comprehensive metabolic panel  . TSH    Suanne Marker, MD 08/14/2019, 4:47 PM Certified in Neurology, Neurophysiology and Neuroimaging  [facility] [address] [place], [place] [address] [phone]

## 2019-08-20 ENCOUNTER — Other Ambulatory Visit (INDEPENDENT_AMBULATORY_CARE_PROVIDER_SITE_OTHER): Payer: Self-pay

## 2019-08-20 ENCOUNTER — Telehealth: Payer: Self-pay | Admitting: *Deleted

## 2019-08-20 ENCOUNTER — Other Ambulatory Visit: Payer: Self-pay | Admitting: *Deleted

## 2019-08-20 DIAGNOSIS — Z0289 Encounter for other administrative examinations: Secondary | ICD-10-CM

## 2019-08-20 DIAGNOSIS — G35 Multiple sclerosis: Secondary | ICD-10-CM

## 2019-08-20 NOTE — Telephone Encounter (Signed)
Patient came in and had labs drawn. She signed Ocrevus start form. Ocrevus Rx start form on MD's desk for completion.

## 2019-08-22 LAB — QUANTIFERON-TB GOLD PLUS
QuantiFERON Mitogen Value: >10 IU/mL
QuantiFERON Nil Value: 0 IU/mL
QuantiFERON TB1 Ag Value: 0 IU/mL
QuantiFERON TB2 Ag Value: 0 IU/mL
QuantiFERON-TB Gold Plus: NEGATIVE

## 2019-08-22 LAB — HEPATITIS B CORE ANTIBODY, TOTAL: Hep B Core Total Ab: NEGATIVE

## 2019-08-22 LAB — COMPREHENSIVE METABOLIC PANEL
ALT: 18 IU/L (ref 0–32)
AST: 19 IU/L (ref 0–40)
Albumin/Globulin Ratio: 1.9 (ref 1.2–2.2)
Albumin: 4.8 g/dL (ref 3.8–4.8)
Alkaline Phosphatase: 80 IU/L (ref 48–121)
BUN/Creatinine Ratio: 21 (ref 9–23)
BUN: 16 mg/dL (ref 6–24)
Bilirubin Total: 0.3 mg/dL (ref 0.0–1.2)
CO2: 26 mmol/L (ref 20–29)
Calcium: 10.4 mg/dL — ABNORMAL HIGH (ref 8.7–10.2)
Chloride: 100 mmol/L (ref 96–106)
Creatinine, Ser: 0.77 mg/dL (ref 0.57–1.00)
GFR calc Af Amer: 110 mL/min/{1.73_m2} (ref >59)
GFR calc non Af Amer: 96 mL/min/{1.73_m2} (ref >59)
Globulin, Total: 2.5 g/dL (ref 1.5–4.5)
Glucose: 97 mg/dL (ref 65–99)
Potassium: 4.1 mmol/L (ref 3.5–5.2)
Sodium: 139 mmol/L (ref 134–144)
Total Protein: 7.3 g/dL (ref 6.0–8.5)

## 2019-08-22 LAB — HEPATITIS C ANTIBODY: Hep C Virus Ab: <0.1 s/co ratio (ref 0.0–0.9)

## 2019-08-22 LAB — HEPATITIS B SURFACE ANTIGEN: Hepatitis B Surface Ag: NEGATIVE

## 2019-08-22 LAB — CBC WITH DIFFERENTIAL/PLATELET
Basophils Absolute: 0.1 10*3/uL (ref 0.0–0.2)
Basos: 0 %
EOS (ABSOLUTE): 0.1 10*3/uL (ref 0.0–0.4)
Eos: 1 %
Hematocrit: 41.8 % (ref 34.0–46.6)
Hemoglobin: 14.1 g/dL (ref 11.1–15.9)
Immature Grans (Abs): 0.1 10*3/uL (ref 0.0–0.1)
Immature Granulocytes: 1 %
Lymphocytes Absolute: 1.7 10*3/uL (ref 0.7–3.1)
Lymphs: 10 %
MCH: 31.5 pg (ref 26.6–33.0)
MCHC: 33.7 g/dL (ref 31.5–35.7)
MCV: 93 fL (ref 79–97)
Monocytes Absolute: 1.1 10*3/uL — ABNORMAL HIGH (ref 0.1–0.9)
Monocytes: 7 %
Neutrophils Absolute: 13.5 10*3/uL — ABNORMAL HIGH (ref 1.4–7.0)
Neutrophils: 81 %
Platelets: 346 10*3/uL (ref 150–450)
RBC: 4.48 x10E6/uL (ref 3.77–5.28)
RDW: 11.8 % (ref 11.7–15.4)
WBC: 16.7 10*3/uL — ABNORMAL HIGH (ref 3.4–10.8)

## 2019-08-22 LAB — HIV ANTIBODY (ROUTINE TESTING W REFLEX): HIV Screen 4th Generation wRfx: NONREACTIVE

## 2019-08-22 LAB — VARICELLA ZOSTER ANTIBODY, IGG: Varicella zoster IgG: 1805 index (ref >165)

## 2019-08-22 LAB — HEPATITIS B SURFACE ANTIBODY,QUALITATIVE: Hep B Surface Ab, Qual: REACTIVE

## 2019-08-22 LAB — TSH: TSH: 2.83 u[IU]/mL (ref 0.450–4.500)

## 2019-08-23 NOTE — Addendum Note (Signed)
Addended by: [place] Pink on: 08/23/2019 02:24 PM   Modules accepted: Orders

## 2019-08-23 NOTE — Telephone Encounter (Signed)
LVM requesting call back or my chart to discuss lab results.

## 2019-08-27 NOTE — Telephone Encounter (Signed)
Received fax from [employer]: Ocrevus start form received, in process and patient navigator will contact with service request status. Patient ID # [id number], Service Req ID: [id number]

## 2019-08-27 NOTE — Telephone Encounter (Signed)
Ocrevus start form completed, signed. Faxed Ocrevus start form, patient demographics, last office note, MRI brain results, insurance card to [employer].

## 2019-08-27 NOTE — Telephone Encounter (Signed)
LVM informing patient her labs are okay with exception of elevated WBC's. Advised she didn't have any symptoms of infection while in office. Dr Marjory Lies needs to repeat CBC in 2-3 weeks. Advised her the order is in for repeat lab, no appointment needed. Also advised her the Ocrevus start form has been faxed in . Left # for questions.

## 2019-09-03 NOTE — Telephone Encounter (Signed)
I called [employer]. They are completing a benefit/reimbursement investigation for pt and expect this to take 3-5 more days.

## 2019-09-04 NOTE — Telephone Encounter (Signed)
Ocrevus [facility] order given to Dr. Marjory Lies for review and signature and awaiting his addendum to OV note with MS type.

## 2019-09-06 ENCOUNTER — Other Ambulatory Visit (INDEPENDENT_AMBULATORY_CARE_PROVIDER_SITE_OTHER): Payer: Self-pay

## 2019-09-06 DIAGNOSIS — G35 Multiple sclerosis: Secondary | ICD-10-CM

## 2019-09-06 DIAGNOSIS — Z0289 Encounter for other administrative examinations: Secondary | ICD-10-CM

## 2019-09-07 LAB — CBC WITH DIFFERENTIAL/PLATELET
Basophils Absolute: 0.1 10*3/uL (ref 0.0–0.2)
Basos: 1 %
EOS (ABSOLUTE): 0.2 10*3/uL (ref 0.0–0.4)
Eos: 2 %
Hematocrit: 39 % (ref 34.0–46.6)
Hemoglobin: 12.9 g/dL (ref 11.1–15.9)
Immature Grans (Abs): 0 10*3/uL (ref 0.0–0.1)
Immature Granulocytes: 0 %
Lymphocytes Absolute: 1.2 10*3/uL (ref 0.7–3.1)
Lymphs: 14 %
MCH: 31.6 pg (ref 26.6–33.0)
MCHC: 33.1 g/dL (ref 31.5–35.7)
MCV: 96 fL (ref 79–97)
Monocytes Absolute: 0.6 10*3/uL (ref 0.1–0.9)
Monocytes: 7 %
Neutrophils Absolute: 6.7 10*3/uL (ref 1.4–7.0)
Neutrophils: 76 %
Platelets: 286 10*3/uL (ref 150–450)
RBC: 4.08 x10E6/uL (ref 3.77–5.28)
RDW: 12.2 % (ref 11.7–15.4)
WBC: 8.8 10*3/uL (ref 3.4–10.8)

## 2019-09-11 ENCOUNTER — Encounter: Payer: Self-pay | Admitting: *Deleted

## 2019-09-11 NOTE — Telephone Encounter (Signed)
I called [employer]. They have completed the benefits investigation and will fax me this information. The next step will be to provide this information to [facility] to get the pt scheduled.

## 2019-09-11 NOTE — Telephone Encounter (Signed)
Received benefits investigation from [employer]. Given to Liane in [facility] for review.

## 2019-09-11 NOTE — Telephone Encounter (Addendum)
I called [employer] again. LVM asking them to call us back to update [place] on the status of this request.

## 2019-09-11 NOTE — Telephone Encounter (Signed)
Gave ocrevus start form and ocrevus order to Liane in [facility].

## 2019-09-20 NOTE — Telephone Encounter (Signed)
I spoke with Freddi Starr, RN in [facility]. They are still waiting on the intake process to be completed. I will continue to follow.

## 2019-10-03 NOTE — Telephone Encounter (Signed)
Pt is scheduled for her first ocrevus 300mg  infusion on 10/17/2019.

## 2020-04-16 ENCOUNTER — Telehealth: Payer: Self-pay | Admitting: Diagnostic Neuroimaging

## 2020-05-06 NOTE — Telephone Encounter (Signed)
Pt scheduled for f/u per infusion nurse, Liane.

## 2020-05-13 ENCOUNTER — Ambulatory Visit: Payer: BC Managed Care – PPO | Admitting: Diagnostic Neuroimaging

## 2020-05-13 ENCOUNTER — Other Ambulatory Visit: Payer: Self-pay

## 2020-05-14 ENCOUNTER — Telehealth: Payer: Self-pay | Admitting: *Deleted

## 2020-05-14 NOTE — Telephone Encounter (Signed)
Per Liane RN, infusion the patient called yesterday and canceled her 1st Ocrevus 600 mg infusion today. She spoke with Selena Batten RN, infusion and stated she wasn't sure she wants to get infusion. She left before being seen yesterday for 11:30  follow up. She arrived 1 hour early, front desk was kept informed of delay. I went to lobby to  room her at approximately 11:55 am and she had left. She has FU in May which was left on her schedule. Called patient, LVM and apologized for her wait yesterday, requested she call back to discuss how we can best help her regarding her message to Selena Batten, RN yesterday.

## 2020-07-22 ENCOUNTER — Encounter: Payer: Self-pay | Admitting: Diagnostic Neuroimaging

## 2020-07-22 ENCOUNTER — Ambulatory Visit: Payer: BC Managed Care – PPO | Admitting: Diagnostic Neuroimaging

## 2020-07-22 VITALS — BP 124/69 | HR 95 | Ht 63.0 in | Wt 130.0 lb

## 2020-07-22 DIAGNOSIS — G35 Multiple sclerosis: Secondary | ICD-10-CM

## 2020-07-22 NOTE — Progress Notes (Signed)
[facility]  PATIENT: Anne Valenzuela DOB: 1975/11/17  REFERRING CLINICIAN:  HISTORY FROM: patient REASON FOR VISIT: follow up   HISTORICAL  CHIEF COMPLAINT:  Chief Complaint  Patient presents with  . Multiple Sclerosis    Rm 6, one year FU "had 2 Ocrevus infusions, 1st one in Aug 2021, brought back all my symptoms of MS, was horrible; feel like I am back to pre infusion status; diet and exercise are my ticket, work part time at [employer]"    HISTORY OF PRESENT ILLNESS:   UPDATE (07/22/20, VRP): Since last visit, tried ocrevus in August 2021; had some reaction (numbness, panic attack). She didn't let us know until March 2022 infusion date, when she called and suddenly cancelled. Overall back to baseline.    UPDATE (07/10/19, VRP): Since last visit, doing well; not started tecfidera. Symptoms are stable. Some intermittent right foot numbness. No alleviating or aggravating factors. Tolerating vitamins.    UPDATE (01/09/19, VRP): Since last visit, doing well. Symptoms are stable. Severity is mild. No alleviating or aggravating factors. Tolerating multi-vitamin. No numbness, weakness, vision changes.   UPDATE (12/06/17, VRP): Since last visit, doing WELL. Symptoms are stable. Severity is mild. No alleviating or aggravating factors. Has been off gilenya. Now has insurance again. Doing well. No new numbness, weakness, slurred speech or vision changes. Fatigue continues. Working at [employer]. Using CBD oil for general muscle pain.    UPDATE 03/22/16: Since last visit, patient had medicaid cancelled for some reason. Now off gilenya. No new MS symptoms.   UPDATE 09/17/15: Since last visit, doing well. Now working out at a gym ([employer]) x 2-3 months and feeling benefits. Tolerating gilenya.   UPDATE 04/04/15: Doing well. Tolerating gilenya and multi-vitamin daily. No new issues. Mood, gait, bladder issues stable.   UPDATE 12/16/14: Since last visit, doing well. Back on gilenya  since Aug 2016. Anxiety, bladder and gait issues are stable. Overall stable, and satisfied with medication mgmt. Does not want to add any new meds to her regimen.  UPDATE 09/11/14: Since last visit, slightly better. Anxiety and urinary issues better with lifesyle changes. Stopped duloxetine (didn't like side effects). Unfortunately, gilenya coverage stopped due to change in medicaid status. Now off meds x 2 weeks. No new neuro symptoms.   UPDATE 06/07/14: Since last visit, no new neuro symptoms. Anxiety still diff, usually with specific situations. Today she feels calm. She stopped sertraline due to diarrhea, then anxiety worsened. Now back on sertraline. Having some urinary incont (sudden urge).   UPDATE 03/04/14: Since last visit, she feels better. Sertraline helping with depression and anxiety, but causing mild diarrhea. Gilenya is stable. No new neuro events.   UPDATE 01/31/14: Since last visit, is back on gilenya consistently. 2-3 weeks ago, had right hand numb/weak, slurred speech, bladder incontinence. Symptoms now almost resolved for 3 days. Didn't call us b/c she had this appt planned for today. Also with more depression, anxiety.   UPDATE 10/23/13: Patient last seen in clinic during PreferMS trial (last visit 10/25/11). Was getting gilenya through patient assistance. Did not have insurance, and therefore did not follow up in our clinic. Over last 1 year, was "stretching" out gilenya taking it every other day. Now out of meds x 1 week. Now has insurance and trying to get re-established. No new neuro symptoms. Overall doing well. Enrolled in school (funeral services program).     UPDATE 11/03/10: Doing well.  Tried TPX and indomethacin for HA, but didn't help.  HA  have subsided on their own.   Now interested in PreferMS trial.  UPDATE 07/31/10: Has been having headaches (right sided, throbbing; no nausea, vomiting, photo or phono), almost daily but usually 1 hour after rebif injections.  tried  fioricet with mild relief.  HA are 5/10 in severity.  Denies new numbness, weakness or vision changes.  No infx sxs.  UPDATE 05/15/10: Started on Rebif in end of Dec 2011.  Some injection site reactions.  Doing about the same.  Weakness essentially resolved.  Depression sxs better.  UPDATE 03/03/10: Doing much better.  All prior sxs essentially resolved.  Back to driving.  Vision, strength, bladder fx much better.  We discussed multiple sclerosis treatmets.  UPDATE 01/13/10: 45 year old female with possible MS.  Hosp f/u visit.  No new complaints.  RUE slightly better.  Still with blurred vision, balance diff. Patient's symptoms started in July 2011, with nausea and diagnosis of urinary tract infection. She then had a 20 pound weight loss. The beginning of October 2011, patient developed right arm weakness and clumsiness. She also had fuzzy vision, headache and urge incontinence. She presented to [facility] and was found to have multiple subcortical white matter lesions on CT scan and confirmed on MRI. She had extensive workup with lab testing and spinal. She was treated with IV solumedrol and then oral prednisone.   REVIEW OF SYSTEMS: Full 14 system review of systems performed and negative except: as per HPI.   ALLERGIES: No Known Allergies  HOME MEDICATIONS: Outpatient Medications Prior to Visit  Medication Sig Dispense Refill  . Multiple Vitamin (MULTIVITAMIN) tablet Take 1 tablet by mouth daily.    [name] Kitchen OVER THE COUNTER MEDICATION CBD OIL sublingual drops daily.    [name] Kitchen ocrelizumab (OCREVUS) 300 MG/10ML injection Inject 600 mg into the vein. 300mg  IV on day 1 & 15. 600mg  IV q 6 months thereafter. [facility]. (Patient not taking: Reported on 07/22/2020)    . TECFIDERA 120 & 240 MG MISC Take 1 capsule by mouth 2 (two) times daily. approved 01/16/2019 through 01/16/2020. (Patient not taking: Reported on 07/22/2020)     No facility-administered medications prior to visit.    PAST MEDICAL  HISTORY: Past Medical History:  Diagnosis Date  . MS (multiple sclerosis) (HCC)     PAST SURGICAL HISTORY: Past Surgical History:  Procedure Laterality Date  . NO PAST SURGERIES      FAMILY HISTORY: Family History  Problem Relation Age of Onset  . Diabetes Mother     SOCIAL HISTORY:  Social History   Socioeconomic History  . Marital status: Married    Spouse name: Not on file  . Number of children: 1  . Years of education: HS  . 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 Smoking status: Never Smoker  . Smokeless tobacco: Never Used  Substance and Sexual Activity  . Alcohol use: No    Alcohol/week: 0.0 standard drinks  . Drug use: No  . Sexual activity: Not on file  Other Topics Concern  . Not on file  Social History Narrative   Patient lives at home with her daughter.   Caffeine Use: tea 3 cups daily   Social Determinants of Health   Financial Resource Strain: Not on file  Food Insecurity: Not on file  Transportation Needs: Not on file  Physical  Activity: Not on file  Stress: Not on file  Social Connections: Not on file  Intimate Partner Violence: Not on file     PHYSICAL EXAM  GENERAL EXAM/CONSTITUTIONAL: Vitals:  Vitals:   07/22/20 0857  BP: 124/69  Pulse: 95  Weight: 130 lb (59 kg)  Height: 5\' 3"  (1.6 m)   Body mass index is 23.03 kg/m. Wt Readings from Last 3 Encounters:  07/22/20 130 lb (59 kg)  07/10/19 133 lb 9.6 oz (60.6 kg)  01/09/19 128 lb 9.6 oz (58.3 kg)    Patient is in no distress; well developed, nourished and groomed; neck is supple  CARDIOVASCULAR:  Examination of carotid arteries is normal; no carotid bruits  Regular rate and rhythm, no murmurs  Examination of peripheral vascular system by observation and palpation is normal  EYES:  Ophthalmoscopic exam of optic discs and  posterior segments is normal; no papilledema or hemorrhages No exam data present  MUSCULOSKELETAL:  Gait, strength, tone, movements noted in Neurologic exam below  NEUROLOGIC: MENTAL STATUS:  No flowsheet data found.  awake, alert, oriented to person, place and time  recent and remote memory intact  normal attention and concentration  language fluent, comprehension intact, naming intact  fund of knowledge appropriate  CRANIAL NERVE:   2nd - no papilledema on fundoscopic exam  2nd, 3rd, 4th, 6th - pupils equal and reactive to light, visual fields full to confrontation, extraocular muscles intact, no nystagmus  5th - facial sensation symmetric  7th - facial strength symmetric  8th - hearing intact  9th - palate elevates symmetrically, uvula midline  11th - shoulder shrug symmetric  12th - tongue protrusion midline  MOTOR:   normal bulk and tone, full strength in the BUE, BLE  SENSORY:   normal and symmetric to light touch, temperature  COORDINATION:   finger-nose-finger, fine finger movements normal  REFLEXES:   deep tendon reflexes present and symmetric  GAIT/STATION:   narrow based gait; ABLE TO WALK TANDEM      DIAGNOSTIC DATA (LABS, IMAGING, TESTING) - I reviewed patient records, labs, notes, testing and imaging myself where available.  Lab Results  Component Value Date   WBC 8.8 09/06/2019   HGB 12.9 09/06/2019   HCT 39.0 09/06/2019   MCV 96 09/06/2019   PLT 286 09/06/2019      Component Value Date/Time   NA 139 08/20/2019 1008   K 4.1 08/20/2019 1008   CL 100 08/20/2019 1008   CO2 26 08/20/2019 1008   GLUCOSE 97 08/20/2019 1008   GLUCOSE 90 12/17/2009 1302   BUN 16 08/20/2019 1008   CREATININE 0.77 08/20/2019 1008   CALCIUM 10.4 (H) 08/20/2019 1008   PROT 7.3 08/20/2019 1008   ALBUMIN 4.8 08/20/2019 1008   AST 19 08/20/2019 1008   ALT 18 08/20/2019 1008   ALKPHOS 80 08/20/2019 1008   BILITOT 0.3 08/20/2019 1008   GFRNONAA  96 08/20/2019 1008   GFRAA 110 08/20/2019 1008   Lab Results  Component Value Date   CHOL  12/18/2009    147        ATP III CLASSIFICATION:  <200     mg/dL   Desirable  161-096  mg/dL   Borderline High  >=045    mg/dL   High          HDL 55 12/18/2009   LDLCALC  12/18/2009    80        Total Cholesterol/HDL:CHD Risk Coronary Heart Disease Risk Table  Men   Women  1/2 Average Risk   3.4   3.3  Average Risk       5.0   4.4  2 X Average Risk   9.6   7.1  3 X Average Risk  23.4   11.0        Use the calculated Patient Ratio above and the CHD Risk Table to determine the patient's CHD Risk.        ATP III CLASSIFICATION (LDL):  <100     mg/dL   Optimal  161-096100-129  mg/dL   Near or Above                    Optimal  130-159  mg/dL   Borderline  045-409160-189  mg/dL   High  >811>190     mg/dL   Very High   TRIG 58 91/47/829510/08/2009   CHOLHDL 2.7 12/18/2009   Lab Results  Component Value Date   HGBA1C  12/18/2009    5.3 (NOTE)                                                                       According to the ADA Clinical Practice Recommendations for 2011, when HbA1c is used as a screening test:   >=6.5%   Diagnostic of Diabetes Mellitus           (if abnormal result  is confirmed)  5.7-6.4%   Increased risk of developing Diabetes Mellitus  References:Diagnosis and Classification of Diabetes Mellitus,Diabetes Care,2011,34(Suppl 1):S62-S69 and Standards of Medical Care in         Diabetes - 2011,Diabetes Care,2011,34  (Suppl 1):S11-S61.   No results found for: VITAMINB12 Lab Results  Component Value Date   TSH 2.830 08/20/2019   Vit D, 25-Hydroxy  Date Value Ref Range Status  01/09/2019 54.8 30.0 - 100.0 ng/mL Final    Comment:    Vitamin D deficiency has been defined by the Institute of Medicine and an Endocrine Society practice guideline as a level of serum 25-OH vitamin D less than 20 ng/mL (1,2). The Endocrine Society went on to further define vitamin  D insufficiency as a level between 21 and 29 ng/mL (2). 1. IOM (Institute of Medicine). 2010. Dietary reference    intakes for calcium and D. [place]: The    [employer]. 2. Holick MF, Binkley Boswell, Bischoff-Ferrari HA, et al.    Evaluation, treatment, and prevention of vitamin D    deficiency: an Endocrine Society clinical practice    guideline. JCEM. 2011 Jul; 96(7):1911-30.   09/17/2015 56.3 30.0 - 100.0 ng/mL Final    Comment:    Vitamin D deficiency has been defined by the Institute of Medicine and an Endocrine Society practice guideline as a level of serum 25-OH vitamin D less than 20 ng/mL (1,2). The Endocrine Society went on to further define vitamin D insufficiency as a level between 21 and 29 ng/mL (2). 1. IOM (Institute of Medicine). 2010. Dietary reference    intakes for calcium and D. [place]: The    [employer]. 2. Holick MF, Binkley Garrison, Bischoff-Ferrari HA, et al.    Evaluation, treatment, and prevention of vitamin D    deficiency: an Endocrine Society  clinical practice    guideline. JCEM. 2011 Jul; 96(7):1911-30.   04/04/2015 35.6 30.0 - 100.0 ng/mL Final    Comment:    Vitamin D deficiency has been defined by the Institute of Medicine and an Endocrine Society practice guideline as a level of serum 25-OH vitamin D less than 20 ng/mL (1,2). The Endocrine Society went on to further define vitamin D insufficiency as a level between 21 and 29 ng/mL (2). 1. IOM (Institute of Medicine). 2010. Dietary reference    intakes for calcium and D. [place]: The    [employer]. 2. Holick MF, Binkley Fleetwood, Bischoff-Ferrari HA, et al.    Evaluation, treatment, and prevention of vitamin D    deficiency: an Endocrine Society clinical practice    guideline. JCEM. 2011 Jul; 96(7):1911-30.    Lymphocytes Absolute  Date Value Ref Range Status  09/06/2019 1.2 0.7 - 3.1 x10E3/uL Final  08/20/2019 1.7 0.7 - 3.1 x10E3/uL Final   01/09/2019 1.4 0.7 - 3.1 x10E3/uL Final    11/05/13 MRI brain (with and without contrast) demonstrating: 1. Multiple supratentorial and infratentorial chronic demyelinating plaques.  2. No abnormal enhancing lesions 3. No significant change from MRI on 05/05/11.  02/22/14 MRI brain (with and without) demonstrating: 1. Multiple periventricular, subcortical, juxtacortical, right pontine and left medullary chronic demyelinating plaques. Some of these are confluent. Some of these are hypointense on T1 views.  2. There are at least 2 small left fronto-parietal DWI hyperintense lesions (isointense on ADC), without enhancement, may represent subacute demyelinating plaques.  3. No abnormal enhancing lesions. 4. Compared to MRI on 11/05/13, there is a new plaque noted in the left frontal region (series 3 image 84; series 8 image 15) with subacute-chronic features, and correlates with patient's recent exacerbation in Nov 2015. Otherwise no significant change.   02/22/14 MRI cervical spine (with and without) demonstrating: 1. The spinal cord is notable chronic demyelinating plaques at the cervico-medullary junction and C2-3 levels. 2. Disc bulging from C2-3 to C6-7. No spinal stenosis or foraminal narrowing. 3. Compared to MRI 12/18/09, the C2-3 plaque is new. The cervico-medullary plaques have decreased in size.  01/31/14 JCV ab - 2.11 (H) positive  10/17/14 MRI brain (with and without) demonstrating: 1. Multiple supratentorial and infratentorial chronic demyelinating plaques. 2. No acute plaques. 3. Compared to prior MRI on 02/22/14, there are no significant new findings, and resolution of prior DWI hyperintensities.   09/25/15 MRI brain (with and without contrast) 1.   There are multiple T2/FLAIR hyperintense foci in the brainstem and hemispheres in a pattern and configuration consistent with chronic demyelinating plaque associated with multiple sclerosis. None of the foci appears to be acute. 2.    Compared to the MRI dated 10/17/2014, there is no interval change. 3.   There are no acute findings.  12/23/17 MRI brain  - supratentorial and infratentorial chronic demyelinating plaques - no acute plaques  12/23/17 MRI cervical spine  - chronic plaque at C2-3 - no acute plaques  08/10/19 MRI brain  1. Redemonstration of numerous supratentorial and infratentorial demyelinating lesions consistent with clinical history of multiple sclerosis.  2. New lesions in the left frontal centrum semiovale and right temporal juxtacortical white matter. No restricted diffusion or abnormal enhancement.  3. Diffuse inflammatory paranasal sinus mucosal thickening is new from prior. This is nonspecific, but could be seen with acute sinusitis.     ASSESSMENT AND PLAN  45 y.o. year old female here with relapsing remitting multiple sclerosis dx'd in 2011. Extensive  workup was done in the hospital from October 5-7, 2011.  MRI brain showed multiple intracranial lesions, multiple enhancing lesions, including asymptomatic and symptomatic enhancing lesions.  MRI of the cervical and thoracic spine showed no intrinsic spinal cord lesions. CSF analysis demonstrated greater than 5 oligoclonal bands, with slight elevation in protein.  Extensive testing ruled out other mimicking conditions (HIV, TSH, ANA, hypercoag panel).  Repeat MRI brain on 02/13/10, showed decrease in lesion size and enhancement. Initially on rebif, now on gileyna since 11/17/10. Then MS exacerbation in Nov 2015, in setting of inconsistent gilenya use in 2015. Then off gilenya in June 2016 due to insurance issues. Now back on gilenya since Aug 2016; but off again in Dec 2017 due to loss of insurance. Offered to restart disease modifying therapy, but patient concerned about safety and side effects. Tried ocrevus in Aug 2021, but had panic attack and numbness reaction.    MS --> stable Anxiety --> stable Urinary incont --> stable   Dx:  MS (multiple  sclerosis) (HCC)    PLAN:  MULTIPLE SCLEROSIS (relapsing, remitting multiple sclerosis; stable) - tried gilenya, ocrevus in the past; declines any other DMT at this time - continue exercise program - continue multi-vitamin support - follow up with PCP   Return for return to PCP, pending if symptoms worsen or fail to improve.    Suanne Marker, MD 07/22/2020, 9:16 AM Certified in Neurology, Neurophysiology and Neuroimaging  [facility] [address] [place], [place] [address] [phone]

## 2020-07-22 NOTE — Patient Instructions (Signed)
-   continue exercise program - continue multi-vitamin support - follow up with PCP

## 2020-11-13 DIAGNOSIS — G35 Multiple sclerosis: Secondary | ICD-10-CM

## 2020-11-13 NOTE — Telephone Encounter (Signed)
Will check MRI brain first.   Orders Placed This Encounter  Procedures   MR BRAIN W WO CONTRAST    Suanne Marker, MD 11/13/2020, 12:24 PM Certified in Neurology, Neurophysiology and Neuroimaging  [facility] [address] [place], [place] [address] [phone]

## 2020-11-18 ENCOUNTER — Telehealth: Payer: Self-pay | Admitting: Diagnostic Neuroimaging

## 2020-11-18 NOTE — Telephone Encounter (Signed)
pt has [place] imaing faxed order they will reach out to the patient to schedule ph # [phone] & fax # [phone].
